# Patient Record
Sex: Female | Born: 1955 | Race: White | Hispanic: No | Marital: Single | Smoking: Former smoker
Health system: Southern US, Community
[De-identification: ages and names within clinical notes are randomized; demographics above are authoritative.]

## PROBLEM LIST (undated history)

## (undated) DIAGNOSIS — M25559 Pain in unspecified hip: Secondary | ICD-10-CM

## (undated) DIAGNOSIS — E7800 Pure hypercholesterolemia, unspecified: Secondary | ICD-10-CM

## (undated) DIAGNOSIS — M549 Dorsalgia, unspecified: Secondary | ICD-10-CM

## (undated) DIAGNOSIS — K8020 Calculus of gallbladder without cholecystitis without obstruction: Secondary | ICD-10-CM

## (undated) HISTORY — DX: Pain in unspecified hip: M25.559

## (undated) HISTORY — PX: PATELLA FRACTURE SURGERY: SHX735

## (undated) HISTORY — DX: Dorsalgia, unspecified: M54.9

## (undated) HISTORY — DX: Pure hypercholesterolemia, unspecified: E78.00

## (undated) HISTORY — PX: CYST REMOVAL NECK: SHX6281

## (undated) HISTORY — DX: Calculus of gallbladder without cholecystitis without obstruction: K80.20

---

## 2000-08-07 ENCOUNTER — Encounter: Payer: Self-pay | Admitting: Family Medicine

## 2000-08-07 ENCOUNTER — Ambulatory Visit (HOSPITAL_COMMUNITY): Admission: RE | Admit: 2000-08-07 | Discharge: 2000-08-07 | Payer: Self-pay | Admitting: Family Medicine

## 2000-08-08 ENCOUNTER — Encounter: Payer: Self-pay | Admitting: Family Medicine

## 2000-08-08 ENCOUNTER — Ambulatory Visit (HOSPITAL_COMMUNITY): Admission: RE | Admit: 2000-08-08 | Discharge: 2000-08-08 | Payer: Self-pay | Admitting: Family Medicine

## 2000-09-22 ENCOUNTER — Ambulatory Visit (HOSPITAL_COMMUNITY): Admission: RE | Admit: 2000-09-22 | Discharge: 2000-09-22 | Payer: Self-pay | Admitting: Family Medicine

## 2000-09-22 ENCOUNTER — Encounter: Payer: Self-pay | Admitting: Family Medicine

## 2005-11-04 ENCOUNTER — Ambulatory Visit (HOSPITAL_COMMUNITY): Admission: RE | Admit: 2005-11-04 | Discharge: 2005-11-04 | Payer: Self-pay | Admitting: Obstetrics and Gynecology

## 2006-11-17 ENCOUNTER — Ambulatory Visit (HOSPITAL_COMMUNITY): Admission: RE | Admit: 2006-11-17 | Discharge: 2006-11-17 | Payer: Self-pay | Admitting: Family Medicine

## 2007-12-18 ENCOUNTER — Ambulatory Visit (HOSPITAL_COMMUNITY): Admission: RE | Admit: 2007-12-18 | Discharge: 2007-12-18 | Payer: Self-pay | Admitting: Family Medicine

## 2008-12-21 ENCOUNTER — Ambulatory Visit (HOSPITAL_COMMUNITY): Admission: RE | Admit: 2008-12-21 | Discharge: 2008-12-21 | Payer: Self-pay | Admitting: Family Medicine

## 2009-02-01 ENCOUNTER — Emergency Department (HOSPITAL_COMMUNITY): Admission: EM | Admit: 2009-02-01 | Discharge: 2009-02-01 | Payer: Self-pay | Admitting: Emergency Medicine

## 2009-02-01 ENCOUNTER — Encounter: Payer: Self-pay | Admitting: Orthopedic Surgery

## 2009-02-06 ENCOUNTER — Ambulatory Visit: Payer: Self-pay | Admitting: Orthopedic Surgery

## 2009-02-06 ENCOUNTER — Telehealth (INDEPENDENT_AMBULATORY_CARE_PROVIDER_SITE_OTHER): Payer: Self-pay | Admitting: *Deleted

## 2009-02-06 DIAGNOSIS — S82009A Unspecified fracture of unspecified patella, initial encounter for closed fracture: Secondary | ICD-10-CM | POA: Insufficient documentation

## 2009-02-07 ENCOUNTER — Ambulatory Visit (HOSPITAL_COMMUNITY): Admission: RE | Admit: 2009-02-07 | Discharge: 2009-02-07 | Payer: Self-pay | Admitting: Orthopedic Surgery

## 2009-02-08 ENCOUNTER — Ambulatory Visit: Payer: Self-pay | Admitting: Orthopedic Surgery

## 2009-02-08 ENCOUNTER — Telehealth: Payer: Self-pay | Admitting: Orthopedic Surgery

## 2009-02-13 ENCOUNTER — Ambulatory Visit: Payer: Self-pay | Admitting: Orthopedic Surgery

## 2009-02-17 ENCOUNTER — Encounter: Payer: Self-pay | Admitting: Orthopedic Surgery

## 2009-02-21 ENCOUNTER — Ambulatory Visit: Payer: Self-pay | Admitting: Orthopedic Surgery

## 2009-02-24 ENCOUNTER — Encounter: Payer: Self-pay | Admitting: Orthopedic Surgery

## 2009-02-27 ENCOUNTER — Encounter: Payer: Self-pay | Admitting: Orthopedic Surgery

## 2009-03-07 ENCOUNTER — Telehealth: Payer: Self-pay | Admitting: Orthopedic Surgery

## 2009-03-17 ENCOUNTER — Encounter: Payer: Self-pay | Admitting: Orthopedic Surgery

## 2009-03-22 ENCOUNTER — Ambulatory Visit: Payer: Self-pay | Admitting: Orthopedic Surgery

## 2009-05-02 ENCOUNTER — Encounter: Payer: Self-pay | Admitting: Orthopedic Surgery

## 2009-05-03 ENCOUNTER — Ambulatory Visit: Payer: Self-pay | Admitting: Orthopedic Surgery

## 2009-05-05 ENCOUNTER — Telehealth: Payer: Self-pay | Admitting: Orthopedic Surgery

## 2009-05-11 ENCOUNTER — Encounter: Payer: Self-pay | Admitting: Orthopedic Surgery

## 2009-05-17 ENCOUNTER — Ambulatory Visit: Payer: Self-pay | Admitting: Orthopedic Surgery

## 2009-05-18 ENCOUNTER — Telehealth: Payer: Self-pay | Admitting: Orthopedic Surgery

## 2009-06-07 ENCOUNTER — Encounter: Payer: Self-pay | Admitting: Orthopedic Surgery

## 2009-06-13 ENCOUNTER — Encounter: Payer: Self-pay | Admitting: Orthopedic Surgery

## 2009-06-15 ENCOUNTER — Ambulatory Visit: Payer: Self-pay | Admitting: Orthopedic Surgery

## 2009-06-16 ENCOUNTER — Telehealth: Payer: Self-pay | Admitting: Orthopedic Surgery

## 2009-06-19 ENCOUNTER — Encounter: Payer: Self-pay | Admitting: Orthopedic Surgery

## 2009-06-29 ENCOUNTER — Telehealth: Payer: Self-pay | Admitting: Orthopedic Surgery

## 2009-06-29 ENCOUNTER — Telehealth (INDEPENDENT_AMBULATORY_CARE_PROVIDER_SITE_OTHER): Payer: Self-pay | Admitting: *Deleted

## 2009-07-10 ENCOUNTER — Telehealth: Payer: Self-pay | Admitting: Orthopedic Surgery

## 2009-07-10 ENCOUNTER — Encounter: Payer: Self-pay | Admitting: Orthopedic Surgery

## 2009-07-13 ENCOUNTER — Encounter: Payer: Self-pay | Admitting: Orthopedic Surgery

## 2009-07-17 ENCOUNTER — Encounter: Payer: Self-pay | Admitting: Orthopedic Surgery

## 2009-07-18 ENCOUNTER — Telehealth: Payer: Self-pay | Admitting: Orthopedic Surgery

## 2009-12-05 ENCOUNTER — Encounter: Payer: Self-pay | Admitting: Orthopedic Surgery

## 2009-12-19 IMAGING — CR DG TIBIA/FIBULA 2V*R*
2 series · 2 of 2 positions shown · non-contrast
Comparison: None

CLINICAL DATA: Right leg pain status post fall

RIGHT TIBIA AND FIBULA - 2 VIEW

[view not recorded (1 of 2)]
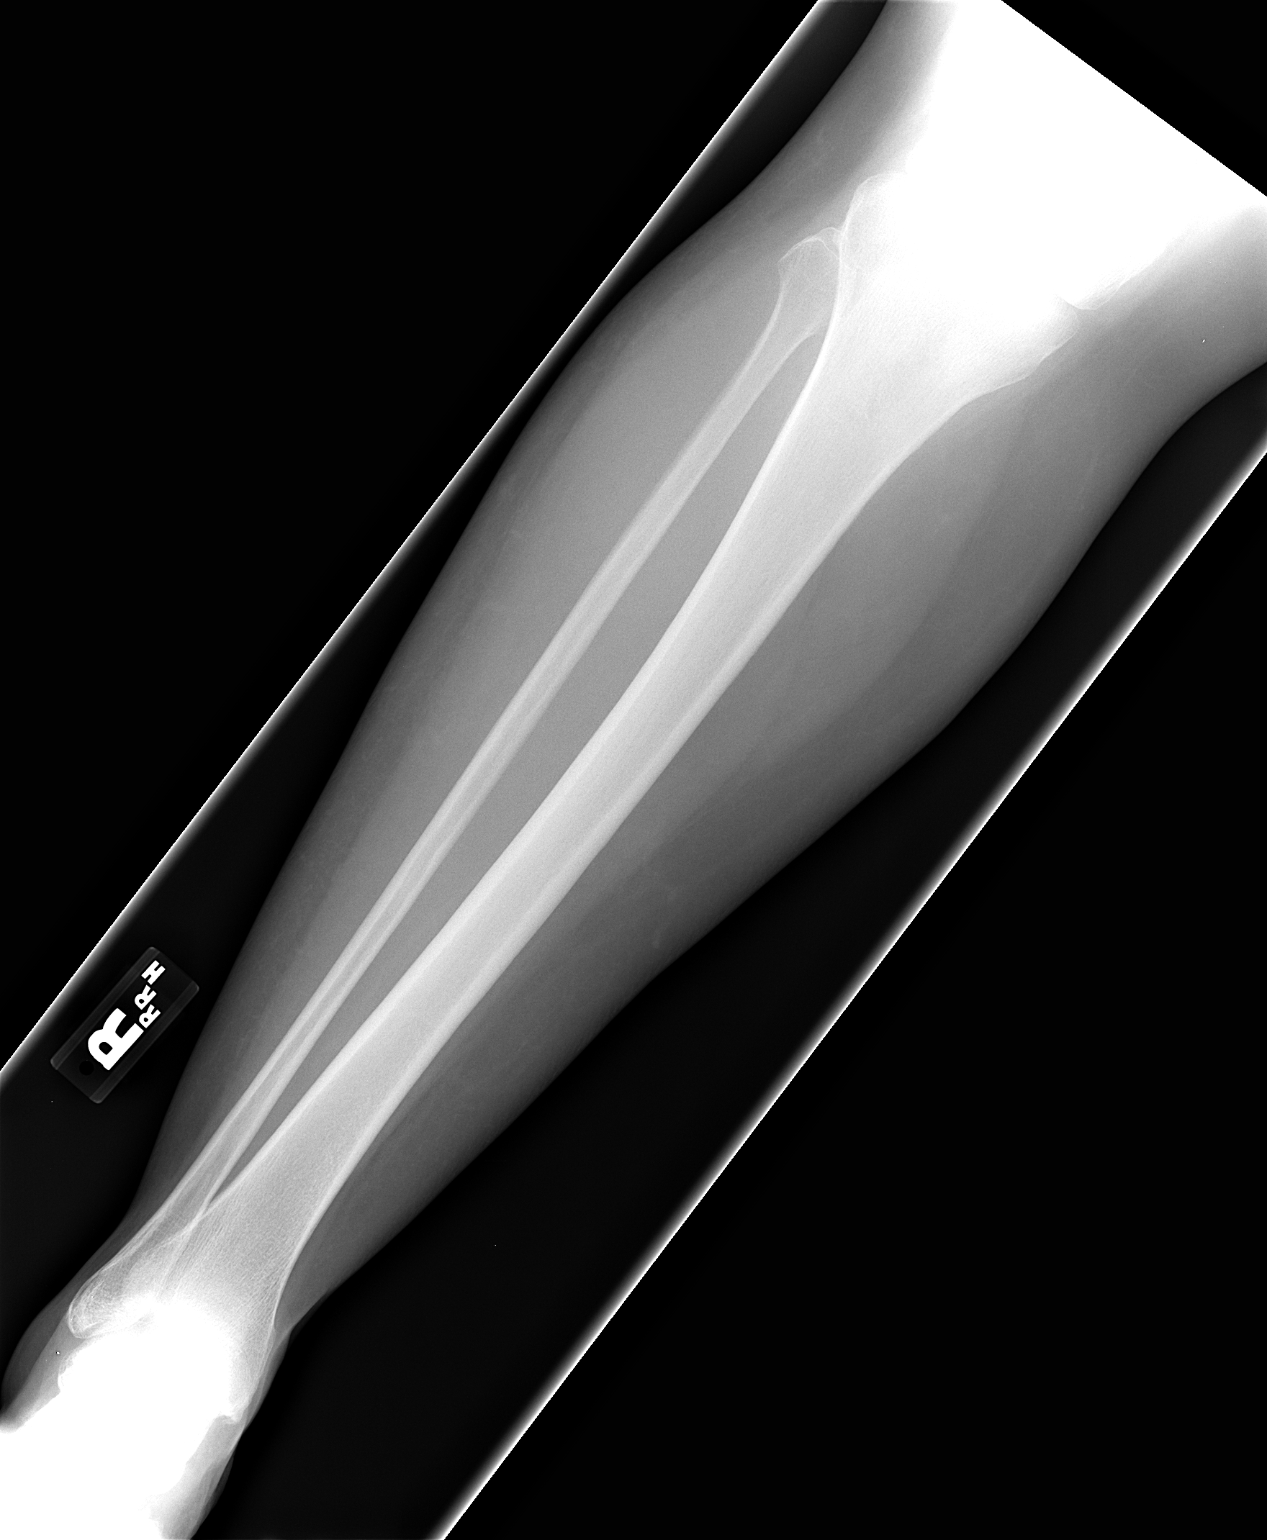

[view not recorded (2 of 2)]
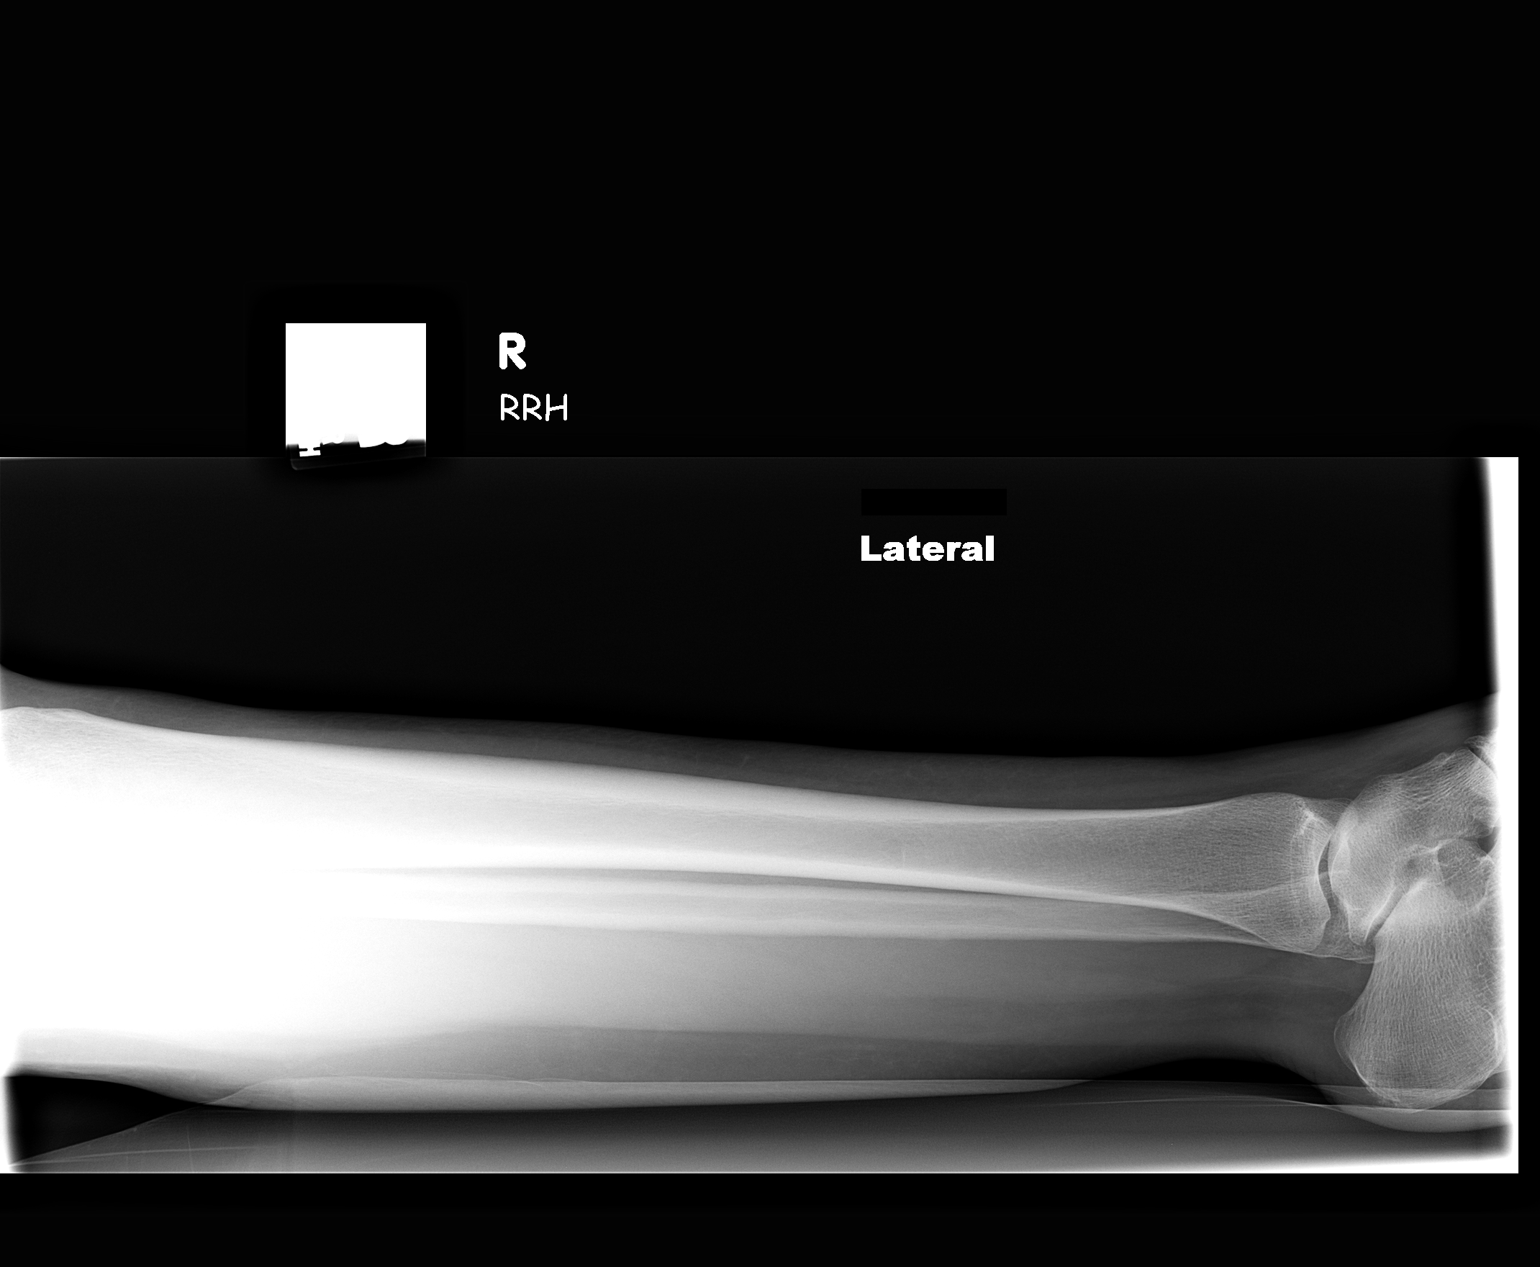

[2 of 2 positions shown; findings below may reference images not displayed]

FINDINGS: Right patellar fracture is better evaluated on the accompanying
knee radiographs.
Bones appear demineralized.
Medial and lateral compartments of the knee and the ankle joint
appear intact.
No additional fracture, dislocation or bone destruction.
Soft tissues unremarkable.
IMPRESSION: No acute right tibial or fibular abnormalities.

## 2009-12-25 ENCOUNTER — Ambulatory Visit (HOSPITAL_COMMUNITY): Admission: RE | Admit: 2009-12-25 | Discharge: 2009-12-25 | Payer: Self-pay | Admitting: Family Medicine

## 2010-04-10 NOTE — Progress Notes (Signed)
Summary: Work note fax to [employer] workers comp Lexicographer of Call: Faxed copy of work status Environmental education officer)  to [employer], worker's comp carrier, to Attn: Helene Kelp, Barrister's clerk, Fax #  [phone] / PH [phone]. Initial call taken by: Cammie Sickle,  Jul 18, 2009 5:59 PM

## 2010-04-10 NOTE — Progress Notes (Signed)
Summary: Work Scientist, product/process development of Call: NOTE:  Name of patient's Work Comp Nurse case Production designer, theatre/television/film who accompanied patient to last office visit:   Jacelyn Pi, ph # [phone] (also nurse mgr Cleda Clarks ph [phone]) Work Comp ins:  J[employer] / Jeris Penta ph [phone] or direct [phone] , fax [phone] Initial call taken by: Cammie Sickle,  June 29, 2009 4:15 PM

## 2010-04-10 NOTE — Letter (Signed)
Summary: Copy of Updated Work note  Copy of Updated Work note   Imported By: Cammie Sickle 07/26/2009 14:11:35  _____________________________________________________________________  External Attachment:    Type:   Image     Comment:   External Document

## 2010-04-10 NOTE — Assessment & Plan Note (Signed)
Summary: 4 WK RE-CK RT PATELLA/W.COMP/[employer] INS/CAF   Visit Type:  Follow-up  CC:  right patella.  History of Present Illness: I saw Sierra Browning in the office today for a 4 week  followup visit.  She is a 55 years old woman with the complaint of:  right patella  DOS 02/07/09 right knee  Procedure  OTIF right patella tension band technique.  Medication: none  she has made major improvement in her range of motion she now has active range of motion 123 of flexion full extension good extension power  She is concerned about palate building where she has to kneel  I agree that this should not be done  This will be a permanent restriction  She is at maximal medical improvement  She will have a permanent restriction I will determine this later    Allergies: No Known Drug Allergies   Impression & Recommendations:  Problem # 1:  AFTERCARE FOLLOW SURGERY MUSCULOSKEL SYSTEM NEC (ICD-V58.78) Assessment Improved  Orders: Est. Patient Level III (04540)  Problem # 2:  CLOSED FRACTURE OF PATELLA (ICD-822.0) Assessment: Improved  Patient Instructions: 1)  No Pallet Building  2)  May return to work: with Permanent restrictions of no pallet buliding, Limit squatting or kneeling  to less than 5 minutes at one time  3)  MMI reached  4)  RTW date: Monday  5)  f/u as needed 6)  rating to follow

## 2010-04-10 NOTE — Progress Notes (Signed)
Summary: Office notes + work status note faxed  Phone Note Air cabin crew of Call: Faxed office note and work status note to [employer]) to contact Nadine Case; and to Work Comp, [employer] [phone] Initial call taken by: Cammie Sickle,  June 16, 2009 12:47 PM

## 2010-04-10 NOTE — Progress Notes (Signed)
Summary: Medical office notes and work note faxed  Phone Note Outgoing Call   Summary of Call: As per empllyer request ([employer]), and with signed patient authorization,  - called HR manager Earlene Plater [phone], notified of work status - states patient will be rec'g FMLA forms to be completed  - contacted medical dept/safety mgr Nadine Case with updated work status  - faxed off notes to work comp nurse case mgr Helene Kelp at [facility]; fax [phone]. Initial call taken by: Cammie Sickle,  May 05, 2009 5:37 PM

## 2010-04-10 NOTE — Letter (Signed)
Summary: Work Note  [facility]  [address]  [place], [place] [address]   Phone: [phone]  Fax: [phone]    Today's Date: Jul 10, 2009  Name of Patient: Sierra Browning [facility]  The above named patient had a medical visit today at: 10:15 am  Please take this into consideration when reviewing the time away from workl.    Special Instructions:   [ X] To continue out of work status, through June 18, 2009;  May return to work on June 19, 2009,  with Permanent restrictions as follows:   June 19, 2009:   - No pallet building    -  Limit squatting and kneeling to less than 5 minutes at one time, every 30 minutes    [X]  Follow up on as needed basis.  ________________________________________________________________________   Sincerely yours,   Terrance Mass, MD

## 2010-04-10 NOTE — Assessment & Plan Note (Signed)
Summary: 2 WK RE-EVAL/RT PATELLA/W COMP/CHUB[employer] INS/CAF   Visit Type:  Follow-up  CC:  post op workers comp.  History of Present Illness: I saw Sierra Browning in the office today for a 4 WEEK followup visit.  She is a 55 years old woman with the complaint of:  RIGHT KNEE. surgery  Worker's compensation.  DOS 02/07/09 right knee  Procedure  OTIF right patella tension band technique.  Ibuprofen 800mg  as needed and Phenergan as needed.  Today to check ROM, evaluate if she needs manipulation after PT everyday for the last 2 weeks.  She can bend knee alot better today 118 degrees.  In post op brace today.  doing well improved   continue PT and OOW x 4 weeks   Allergies: No Known Drug Allergies   Impression & Recommendations:  Problem # 1:  AFTERCARE FOLLOW SURGERY MUSCULOSKEL SYSTEM NEC (ICD-V58.78)  Orders: Post-Op Check (04540)  Problem # 2:  CLOSED FRACTURE OF PATELLA (ICD-822.0)  Orders: Post-Op Check (98119)  Patient Instructions: 1)  Continue therapy 2)  come back to see [place] in 4 weeks 3)  OOW for 4 weeks

## 2010-04-10 NOTE — Assessment & Plan Note (Signed)
Summary: 6 WK RE-CK RT PATELLA/NO XRAY THIS VST/W.COMP,DOI 02/01/09/CAF   Visit Type:  Follow-up  CC:  post op workers comp.  History of Present Illness: I saw Sierra Browning in the office today for a 4 WEEK followup visit.  She is a 55 years old woman with the complaint of:  RIGHT KNEE. surgery  Worker's compensation.  DOS 02/07/09 right knee  week # 10   Procedure  OTIF right patella tension band technique.  Ibuprofen 800mg  as needed and Phenergan as needed.  She is having therapy in the [facility] at [facility] but her flexion is still only 90  I tried to bend her knee today and got it up to 105 but she did not tolerate it from a pain standpoint and I think this is the main problem.  I talked to the therapist asked him to work on a more.  I will see her daily for the next 2 weeks and then she'll come back and we'll reassess the situation  Allergies (verified): No Known Drug Allergies   Impression & Recommendations:  Problem # 1:  CLOSED FRACTURE OF PATELLA (ICD-822.0)  SPOKE WITH THERAPIST  -ASKED TO SEE DAILY AND DO AGGRESSIVE ROM IN SUPINE POSITION   Orders: Physical Therapy Referral (PT) Post-Op Check (04540)  Problem # 2:  AFTERCARE FOLLOW SURGERY MUSCULOSKEL SYSTEM NEC (ICD-V58.78)  Patient Instructions: 1)  BACK to THERAPY  2)  DAILY next 2 weeks  3)  then return here for re-evaluation.  4)  Recommended remaining out of work; out of work note provided.

## 2010-04-10 NOTE — Letter (Signed)
Summary: Work [facility]  [address]  [place], [place] [address]   Phone: [phone]  Fax: [phone]      Today's Date: June 15, 2009  Name of Patient: Sierra Browning [facility]  The above named patient had a medical visit today at: 10:15 am  Please take this into consideration when reviewing the time away from workl.    Special Instructions:   [ X] To continue out of work status, through June 18, 2009; Returning to work with Permanent restrictions as follows: on June 19, 2009:   - No pallet building    -  Limit squatting and kneeling to less than 5 minutes at one time    [X]  Follow up on as needed basis.  ________________________________________________________________________   Sincerely yours,   Terrance Mass, MD

## 2010-04-10 NOTE — Letter (Signed)
Summary: Out of Work  [facility]  [address]  [place], [place] [address]   Phone: [phone]  Fax: [phone]    March 22, 2009   Employee:  Sierra Browning    To Whom It May Concern:   For Medical reasons, please continue out of work status for the above named employee from work for the following dates:  Start:   February 01, 2009, continuing through the next 4 to 6 months  Next scheduled appointment: May 03, 2009  Approximate Return to Work:  Jul 31, 2009    If you need additional information, please feel free to contact our office.         Sincerely,    Terrance Mass, MD

## 2010-04-10 NOTE — Letter (Signed)
Summary: Out of Work  [facility]  [address]  [place], [place] [address]   Phone: [phone]  Fax: [phone]    May 17, 2009   Employee:  Sierra Browning Sierra Browning    To Whom It May Concern:   For Medical reasons, please excuse the above named employee from work for the following dates:  Start (Continue) Out of work status, original out of work start 02/01/10  End:   June 15, 2009  Next appointment date:  June 15, 2009, 10:15AM  Approximate Return to work date Jul 15, 2009  If you need additional information, please feel free to contact our office.         Sincerely,    Terrance Mass, MD

## 2010-04-10 NOTE — Miscellaneous (Signed)
Summary: Rehab Report  Rehab Report   Imported By: Elvera Maria 06/16/2009 10:09:07  _____________________________________________________________________  External Attachment:    Type:   Image     Comment:   progress note

## 2010-04-10 NOTE — Letter (Signed)
Summary: Workers Theatre stage manager on kneeling  Workers compensation clarification on kneeling   Imported By: Jacklynn Ganong 06/27/2009 10:26:25  _____________________________________________________________________  External Attachment:    Type:   Image     Comment:   External Document

## 2010-04-10 NOTE — Miscellaneous (Signed)
Summary: PT Plan of care  PT Plan of care   Imported By: Jacklynn Ganong 06/27/2009 09:08:39  _____________________________________________________________________  External Attachment:    Type:   Image     Comment:   External Document

## 2010-04-10 NOTE — Miscellaneous (Signed)
Summary: Rehab Report  Rehab Report   Imported By: Elvera Maria 05/19/2009 09:11:21  _____________________________________________________________________  External Attachment:    Type:   Image     Comment:   progress note

## 2010-04-10 NOTE — Medication Information (Signed)
Summary: Nutritional therapist wheelchair   Imported By: Cammie Sickle 06/06/2009 10:56:10  _____________________________________________________________________  External Attachment:    Type:   Image     Comment:   External Document

## 2010-04-10 NOTE — Letter (Signed)
Summary: Office notes faxed W Comp [employer]  Office notes faxed W Comp [employer]   Imported By: Cammie Sickle 05/03/2009 12:04:48  _____________________________________________________________________  External Attachment:    Type:   Image     Comment:   External Document

## 2010-04-10 NOTE — Progress Notes (Signed)
Summary: request further information work status  Phone Note Call from Patient   Summary of Call: Patient called to request information again per employer and per [employer], as to work restrictions and rating;  states employer tried to reach [place] Friday afternoon after hours. She has not yet returned to work. Work status note attached for review, completion, and signature/forms in Dr's box. Initial call taken by: Cammie Sickle,  Jul 10, 2009 2:32 PM

## 2010-04-10 NOTE — Letter (Signed)
Summary: FMLA form  FMLA form   Imported By: Cammie Sickle 06/12/2009 12:08:42  _____________________________________________________________________  External Attachment:    Type:   Image     Comment:   External Document

## 2010-04-10 NOTE — Letter (Signed)
Summary: Work Production manager questionnaire  Work Comp Medical Status questionnaire   Imported By: Cammie Sickle 06/07/2009 12:46:27  _____________________________________________________________________  External Attachment:    Type:   Image     Comment:   External Document

## 2010-04-10 NOTE — Medication Information (Signed)
Summary: Chief Executive Officer   Imported By: Cammie Sickle 06/06/2009 10:54:31  _____________________________________________________________________  External Attachment:    Type:   Image     Comment:   External Document

## 2010-04-10 NOTE — Letter (Signed)
Summary: Eval for Permanent Impairment  Eval for Permanent Impairment   Imported By: Cammie Sickle 12/30/2009 19:46:02  _____________________________________________________________________  External Attachment:    Type:   Image     Comment:   External Document

## 2010-04-10 NOTE — Letter (Signed)
Summary: Generic Letter Work Release note  [facility]  [address]  [place], [place] [address]   Phone: [phone]  Fax: [phone]    07/17/2009  RE:   [name] [address] [place], [place]  [address]   To whom it may concern  Reference Sierra Browning, date of birth November 19, 1955 with the medical record number of [id number]  Listed below he will find the permanent partial impairment rating and instructions for restrictions regarding this patient  May return to work on June 19, 2009,  with Permanent restrictions as follows:   June 19, 2009:   - No pallet building    -  Limit squatting and kneeling to less than 5 minutes at one time, every 30 minutes  using the [employer] as a source the permanent partial impairment will be 15% of the RIGHT knee secondary to lack of full range of motion, and surgical treatment of the RIGHT knee for patella  * Addendum:  Jul 13, 2009  - May stand for 8 hour shift as required   Sincerely,    Fuller Canada MD

## 2010-04-10 NOTE — Assessment & Plan Note (Signed)
Summary: 4 WK RE-CK/XRAYS RT PATELLA,DOI 02/01/09/W.COMP/CAF   Visit Type:  Follow-up  CC:  postop visit.  History of Present Illness: I saw Sierra Browning in the office today for a 4 WEEK followup visit.  She is a 55 years old woman with the complaint of:  RIGHT KNEE. surgery  Worker's compensation.  Date of surgery  DOS 02/07/09 right knee  Procedure  OTIF right patella tension band technique.  Medication  Ibuprofen 800 and Phenergan.  She needs her Phenergan refilled.  Subjectives Recheck after starting PT and xrays.  Patient states her knee is doing better.  Knee incision looks great. Her patella seems a little tight, was only able to get 40 of knee flexion. PT notes indicate 65, brace is set at 70.  AP and lateral RIGHT knee X-rays show internal fixation of the patella with near anatomic alignment. status post O TIF of the patella with near anatomic alignment  Assessment doing well, but the knee seems little tight in her range of motion should be 90 at this time.  Recommend increase the flexion with passive, active, and active-assisted range of motion, continue brace advance as tolerated. Followup in 6 weeks recheck     Allergies: No Known Drug Allergies   Medications Added to Medication List This Visit: 1)  Promethazine Hcl 25 Mg Tabs (Promethazine hcl) .Marland Kitchen.. 1 q 4 as needed  Other Orders: Post-Op Check (14782) Knee x-ray, 1/2 views (95621)  Patient Instructions: 1)  6 weeks f/u  Prescriptions: PROMETHAZINE HCL 25 MG TABS (PROMETHAZINE HCL) 1 q 4 as needed  #60 x 2   Entered and Authorized by:   Fuller Canada MD   Signed by:   Fuller Canada MD on 03/22/2009   Method used:   Handwritten   RxID:   [id number]

## 2010-04-10 NOTE — Progress Notes (Signed)
Summary: How many PT visits per week?  Phone Note Other Incoming   Summary of Call: Amy at [employer] called regarding Labrina Lines (Aug 23, 2055) Do you want Devlin to continue PT every day or reduce the number of visits per week. Amy's # [phone] Initial call taken by: Jacklynn Ganong,  May 18, 2009 1:33 PM  Follow-up for Phone Call        3 Follow-up by: Fuller Canada MD,  May 18, 2009 2:43 PM  Additional Follow-up for Phone Call Additional follow up Details #1::        Advised  Amy of Dr. Mort Sawyers reply Additional Follow-up by: Jacklynn Ganong,  May 18, 2009 3:40 PM

## 2010-04-10 NOTE — Progress Notes (Signed)
Summary: Employer request information  Phone Note Call from Patient   Caller: Patient Summary of Call: Patient has brought in Employee request form.  Advised that her portion is to be completed.  She has her job description attached.  States HR manager needs specifics on restrictions. Dr Romeo Apple to review, and to forward in stamped envelope provided.  Fwd'd to Dr Romeo Apple ( in box).   Initial call taken by: Cammie Sickle,  June 29, 2009 4:08 PM

## 2010-04-10 NOTE — Letter (Signed)
Summary: Authorization to release to employer  Authorization to release to employer   Imported By: Cammie Sickle 05/17/2009 16:01:18  _____________________________________________________________________  External Attachment:    Type:   Image     Comment:   External Document

## 2010-04-10 NOTE — Letter (Signed)
Summary: Generic Letter  [facility]  [address]  [place], [place] [address]   Phone: [phone]  Fax: [phone]    07/13/2009  To whom it may concern  Reference Sierra Browning, date of birth September 02, 1955 with the medical record number of [id number]  Listed below he will find the permanent partial impairment rating and instructions for restrictions regarding this patient  May return to work on June 19, 2009,  with Permanent restrictions as follows:   June 19, 2009:   - No pallet building    -  Limit squatting and kneeling to less than 5 minutes at one time, every 30 minutes  using the [employer] as a source the permanent partial impairment will be 15% of the RIGHT knee secondary to lack of full range of motion, and surgical treatment of the RIGHT knee for patella          Sincerely,   Fuller Canada MD

## 2010-04-10 NOTE — Miscellaneous (Signed)
Summary: PT Progress evaluation  PT Progress evaluation   Imported By: Jacklynn Ganong 05/10/2009 07:57:07  _____________________________________________________________________  External Attachment:    Type:   Image     Comment:   External Document

## 2010-04-10 NOTE — Letter (Signed)
Summary: Office notes fax to [employer]  Office notes fax to [employer]   Imported By: Cammie Sickle 05/03/2009 12:03:27  _____________________________________________________________________  External Attachment:    Type:   Image     Comment:   External Document

## 2010-04-10 NOTE — Letter (Signed)
Summary: Out of Work  [facility]  [address]  [place], [place] [address]   Phone: [phone]  Fax: [phone]    May 03, 2009   Employee:  WINNA GOLLA Medlin    To Whom It May Concern:   For Medical reasons, please excuse the above named employee from work for the following dates:   Continue out of work status,   Start date: 02/01/09   End date:   06/01/2009, or until further notice   Next scheduled appointment:  05/17/09 at 12:00 Noon   If you need additional information, please feel free to contact our office.         Sincerely,    Terrance Mass, MD

## 2010-06-13 LAB — BASIC METABOLIC PANEL
BUN: 13 mg/dL (ref 6–23)
CO2: 28 mEq/L (ref 19–32)
GFR calc non Af Amer: >60 mL/min (ref >60)
Glucose, Bld: 108 mg/dL — ABNORMAL HIGH (ref 70–99)
Potassium: 4.2 mEq/L (ref 3.5–5.1)
Sodium: 137 mEq/L (ref 135–145)

## 2010-11-28 ENCOUNTER — Other Ambulatory Visit (HOSPITAL_COMMUNITY): Payer: Self-pay | Admitting: Family Medicine

## 2010-11-28 DIAGNOSIS — Z139 Encounter for screening, unspecified: Secondary | ICD-10-CM

## 2010-12-31 ENCOUNTER — Ambulatory Visit (HOSPITAL_COMMUNITY)
Admission: RE | Admit: 2010-12-31 | Discharge: 2010-12-31 | Disposition: A | Discharge status: Home / Self Care | Payer: No Typology Code available for payment source | Source: Ambulatory Visit | Attending: Family Medicine | Admitting: Family Medicine

## 2010-12-31 DIAGNOSIS — Z1231 Encounter for screening mammogram for malignant neoplasm of breast: Secondary | ICD-10-CM | POA: Insufficient documentation

## 2010-12-31 DIAGNOSIS — Z139 Encounter for screening, unspecified: Secondary | ICD-10-CM

## 2012-07-15 ENCOUNTER — Telehealth: Payer: Self-pay | Admitting: Orthopedic Surgery

## 2012-07-15 NOTE — Telephone Encounter (Signed)
Patient called to ask if she may get a temporary handicapped placard.  She was last seen here 06/16/09, knee evaluation/fracture care for "broken knee cap" (healed).  She did not provide any detailed information about why she needs; states knee is "doing okay."  Please advise.  Phone [phone].

## 2012-07-16 NOTE — Telephone Encounter (Signed)
Called back to patient; explained that we would need to be able to support the medical reason for the parking permit, and reviewed that her last visit was >3 years ago as noted.  I offered appointment, in order to re-evaluate knee; patient does not wish to schedule at this time.  Aware we are therefore unable to sign for a parking permit at this time.

## 2012-07-16 NOTE — Telephone Encounter (Signed)
Why does she need it then ??

## 2013-04-20 ENCOUNTER — Ambulatory Visit: Payer: No Typology Code available for payment source

## 2013-04-20 ENCOUNTER — Ambulatory Visit (INDEPENDENT_AMBULATORY_CARE_PROVIDER_SITE_OTHER): Payer: No Typology Code available for payment source | Admitting: Orthopedic Surgery

## 2013-04-20 VITALS — BP 130/77 | Ht 64.5 in | Wt 148.0 lb

## 2013-04-20 DIAGNOSIS — Z9889 Other specified postprocedural states: Secondary | ICD-10-CM

## 2013-04-20 DIAGNOSIS — R110 Nausea: Secondary | ICD-10-CM

## 2013-04-20 MED ORDER — PROMETHAZINE HCL 12.5 MG PO TABS: 12.5000 mg | ORAL_TABLET | Freq: Four times a day (QID) | ORAL | Status: DC | PRN

## 2013-04-20 NOTE — Patient Instructions (Signed)
activities as tolerated 

## 2013-04-20 NOTE — Progress Notes (Signed)
Patient ID: Bunnie PionSandra G Browning, female   DOB: 1956/01/11, 58 y.o.   MRN: [id number] Chief Complaint  Patient presents with  . Knee Problem    Re evaluation of right knee for permanent job restrictions    The patient really came in because her forms needed to be updated she had O2 treatment internal fixation of the right patella and 2010 she returned to work. 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

## 2013-04-23 ENCOUNTER — Encounter: Payer: Self-pay | Admitting: Orthopedic Surgery

## 2015-06-07 ENCOUNTER — Other Ambulatory Visit (HOSPITAL_COMMUNITY): Payer: Self-pay | Admitting: Family Medicine

## 2015-06-07 ENCOUNTER — Ambulatory Visit (HOSPITAL_COMMUNITY)
Admission: RE | Admit: 2015-06-07 | Discharge: 2015-06-07 | Disposition: A | Discharge status: Home / Self Care | Payer: BLUE CROSS/BLUE SHIELD | Source: Ambulatory Visit | Attending: Family Medicine | Admitting: Family Medicine

## 2015-06-07 DIAGNOSIS — M79601 Pain in right arm: Secondary | ICD-10-CM

## 2015-11-29 ENCOUNTER — Ambulatory Visit (HOSPITAL_COMMUNITY)
Admission: RE | Admit: 2015-11-29 | Discharge: 2015-11-29 | Disposition: A | Discharge status: Home / Self Care | Payer: BLUE CROSS/BLUE SHIELD | Source: Ambulatory Visit | Attending: Family Medicine | Admitting: Family Medicine

## 2015-11-29 ENCOUNTER — Other Ambulatory Visit (HOSPITAL_COMMUNITY): Payer: Self-pay | Admitting: Family Medicine

## 2015-11-29 DIAGNOSIS — M160 Bilateral primary osteoarthritis of hip: Secondary | ICD-10-CM | POA: Diagnosis not present

## 2015-11-29 DIAGNOSIS — E782 Mixed hyperlipidemia: Secondary | ICD-10-CM | POA: Diagnosis not present

## 2015-11-29 DIAGNOSIS — E663 Overweight: Secondary | ICD-10-CM | POA: Diagnosis not present

## 2015-11-29 DIAGNOSIS — M25551 Pain in right hip: Secondary | ICD-10-CM

## 2015-11-29 DIAGNOSIS — M5136 Other intervertebral disc degeneration, lumbar region: Secondary | ICD-10-CM | POA: Diagnosis not present

## 2015-11-29 DIAGNOSIS — Z1389 Encounter for screening for other disorder: Secondary | ICD-10-CM | POA: Diagnosis not present

## 2015-11-29 DIAGNOSIS — Z6827 Body mass index (BMI) 27.0-27.9, adult: Secondary | ICD-10-CM | POA: Diagnosis not present

## 2015-11-29 DIAGNOSIS — R5383 Other fatigue: Secondary | ICD-10-CM | POA: Diagnosis not present

## 2015-12-06 DIAGNOSIS — R945 Abnormal results of liver function studies: Secondary | ICD-10-CM | POA: Diagnosis not present

## 2015-12-06 DIAGNOSIS — Z6827 Body mass index (BMI) 27.0-27.9, adult: Secondary | ICD-10-CM | POA: Diagnosis not present

## 2015-12-06 DIAGNOSIS — R7309 Other abnormal glucose: Secondary | ICD-10-CM | POA: Diagnosis not present

## 2015-12-06 DIAGNOSIS — M25551 Pain in right hip: Secondary | ICD-10-CM | POA: Diagnosis not present

## 2015-12-06 DIAGNOSIS — R748 Abnormal levels of other serum enzymes: Secondary | ICD-10-CM | POA: Diagnosis not present

## 2015-12-26 DIAGNOSIS — M25551 Pain in right hip: Secondary | ICD-10-CM | POA: Diagnosis not present

## 2015-12-26 DIAGNOSIS — R7309 Other abnormal glucose: Secondary | ICD-10-CM | POA: Diagnosis not present

## 2015-12-26 DIAGNOSIS — R945 Abnormal results of liver function studies: Secondary | ICD-10-CM | POA: Diagnosis not present

## 2015-12-26 DIAGNOSIS — R748 Abnormal levels of other serum enzymes: Secondary | ICD-10-CM | POA: Diagnosis not present

## 2016-01-04 DIAGNOSIS — Z23 Encounter for immunization: Secondary | ICD-10-CM | POA: Diagnosis not present

## 2016-01-11 DIAGNOSIS — M545 Low back pain: Secondary | ICD-10-CM | POA: Diagnosis not present

## 2016-01-11 DIAGNOSIS — Z6827 Body mass index (BMI) 27.0-27.9, adult: Secondary | ICD-10-CM | POA: Diagnosis not present

## 2016-01-11 DIAGNOSIS — Z1389 Encounter for screening for other disorder: Secondary | ICD-10-CM | POA: Diagnosis not present

## 2016-01-18 ENCOUNTER — Encounter (INDEPENDENT_AMBULATORY_CARE_PROVIDER_SITE_OTHER): Payer: Self-pay | Admitting: Internal Medicine

## 2016-01-18 ENCOUNTER — Encounter (INDEPENDENT_AMBULATORY_CARE_PROVIDER_SITE_OTHER): Payer: Self-pay

## 2016-01-25 ENCOUNTER — Ambulatory Visit (INDEPENDENT_AMBULATORY_CARE_PROVIDER_SITE_OTHER): Payer: BLUE CROSS/BLUE SHIELD | Admitting: Internal Medicine

## 2016-01-25 ENCOUNTER — Encounter (INDEPENDENT_AMBULATORY_CARE_PROVIDER_SITE_OTHER): Payer: Self-pay | Admitting: Internal Medicine

## 2016-01-25 ENCOUNTER — Encounter (INDEPENDENT_AMBULATORY_CARE_PROVIDER_SITE_OTHER): Payer: Self-pay

## 2016-01-25 VITALS — BP 148/82 | HR 80 | Temp 98.1°F | Ht 64.5 in | Wt 164.3 lb

## 2016-01-25 DIAGNOSIS — M549 Dorsalgia, unspecified: Secondary | ICD-10-CM | POA: Insufficient documentation

## 2016-01-25 DIAGNOSIS — M25559 Pain in unspecified hip: Secondary | ICD-10-CM | POA: Insufficient documentation

## 2016-01-25 DIAGNOSIS — R748 Abnormal levels of other serum enzymes: Secondary | ICD-10-CM

## 2016-01-25 LAB — HEPATIC FUNCTION PANEL
ALK PHOS: 107 U/L (ref 33–130)
ALT: 23 U/L (ref 6–29)
AST: 17 U/L (ref 10–35)
Albumin: 4.1 g/dL (ref 3.6–5.1)
BILIRUBIN DIRECT: 0.1 mg/dL (ref <=0.2)
BILIRUBIN INDIRECT: 0.4 mg/dL (ref 0.2–1.2)
BILIRUBIN TOTAL: 0.5 mg/dL (ref 0.2–1.2)
Total Protein: 6.5 g/dL (ref 6.1–8.1)

## 2016-01-25 NOTE — Patient Instructions (Addendum)
Will repeat Hepatic function.  OV in 3 months.

## 2016-01-25 NOTE — Progress Notes (Signed)
   Subjective:    Patient ID: Sierra Browning, female    DOB: 15-May-1955, 60 y.o.   MRN: [id number]  HPI Referred by Dr. Phillips OdorGolding for elevated liver enzymes/colonoscopy. She tells me she had been taken an excessive amt of Motrin for back and hip pain. She was taking about 4 Motrin  A day. She had one incidence that she took 6 Motrin (200mg ). Her liver enzymes have never been elevated that she is aware. She has chronic rt hip pain and back pain since May of this year. No known injury. Her appetite is good. No weight loss.  She has just finished Prednisone for her back pain. She usually has a BM daily. No melena or BRRB. No change in her stools. She has never had a colonoscopy in the past. No family hx of colon cancer. No hx of IV drugs or tattoos.    11/29/2015 ALP 140, AST 27, ALT 36.     Review of Systems No past medical history on file.  No past surgical history on file.  No Known Allergies  Current Outpatient Prescriptions on File Prior to Visit  Medication Sig Dispense Refill  . promethazine (PHENERGAN) 12.5 MG tablet Take 1 tablet (12.5 mg total) by mouth every 6 (six) hours as needed for nausea or vomiting. (Patient not taking: Reported on 01/25/2016) 30 tablet 0   No current facility-administered medications on file prior to visit.        Objective:   Physical Exam Blood pressure (!) 148/82, pulse 80, temperature 98.1 F (36.7 C), height 5' 4.5" (1.638 m), weight 164 lb 4.8 oz (74.5 kg).  Alert and oriented. Skin warm and dry. Oral mucosa is moist.   . Sclera anicteric, conjunctivae is pink. Thyroid not enlarged. No cervical lymphadenopathy. Lungs clear. Heart regular rate and rhythm.  Abdomen is soft. Bowel sounds are positive. No hepatomegaly. No abdominal masses felt. No tenderness.  No edema to lower extremities.        Assessment & Plan:  Elevated liver enzymes: Am going to repeat number.   Screening colonoscopy.She would like to wait for the colonoscopy. She  has no insurance and would like to wait till after her MRI of the back.

## 2016-01-26 ENCOUNTER — Telehealth (INDEPENDENT_AMBULATORY_CARE_PROVIDER_SITE_OTHER): Payer: Self-pay | Admitting: Internal Medicine

## 2016-01-26 NOTE — Telephone Encounter (Signed)
Hepatic function I 3 months.

## 2016-01-29 ENCOUNTER — Other Ambulatory Visit (INDEPENDENT_AMBULATORY_CARE_PROVIDER_SITE_OTHER): Payer: Self-pay | Admitting: *Deleted

## 2016-01-29 DIAGNOSIS — R748 Abnormal levels of other serum enzymes: Secondary | ICD-10-CM

## 2016-01-29 NOTE — Telephone Encounter (Signed)
Hepatic Function is noted for 3 months. A letter will be sent as a reminder.

## 2016-02-21 ENCOUNTER — Ambulatory Visit (INDEPENDENT_AMBULATORY_CARE_PROVIDER_SITE_OTHER): Payer: BLUE CROSS/BLUE SHIELD | Admitting: Orthopedic Surgery

## 2016-02-21 ENCOUNTER — Encounter: Payer: Self-pay | Admitting: Orthopedic Surgery

## 2016-02-21 ENCOUNTER — Ambulatory Visit (INDEPENDENT_AMBULATORY_CARE_PROVIDER_SITE_OTHER): Payer: BLUE CROSS/BLUE SHIELD

## 2016-02-21 VITALS — BP 125/90 | HR 105 | Ht 65.5 in | Wt 168.0 lb

## 2016-02-21 DIAGNOSIS — M5126 Other intervertebral disc displacement, lumbar region: Secondary | ICD-10-CM | POA: Diagnosis not present

## 2016-02-21 DIAGNOSIS — M5441 Lumbago with sciatica, right side: Secondary | ICD-10-CM | POA: Diagnosis not present

## 2016-02-21 MED ORDER — GABAPENTIN 100 MG PO CAPS: 100.0000 mg | ORAL_CAPSULE | Freq: Three times a day (TID) | ORAL | 2 refills | Status: DC

## 2016-02-21 NOTE — Progress Notes (Signed)
Patient ID: Sierra PionSandra G Browning, female   DOB: [birth year], [age] y.o.   MRN: [id number]  Chief Complaint  Patient presents with  . Back Pain    BACK PAIN WITH RIGHT SIDED RADICULAR PAIN    HPI Sierra Browning is a 60 y.o. female.  60 year old female presents with 6 month history of pain in her lower back which progressively radiated to her right leg associated with numbness tingling and weakness. Prior treatment includes 3 IM shots of prednisone and ibuprofen  2 rounds of prednisone Dosepak without relief  The patient was told not to take anti-inflammatory second to elevation of her LFTs which resolved when her Motrin stopped  Review of Systems Review of Systems  Constitutional: Negative for fever.  Cardiovascular: Negative.   Gastrointestinal: Negative.   Genitourinary: Negative.   Musculoskeletal: Positive for back pain.  Neurological: Positive for numbness. Negative for weakness.    Past Medical History:  Diagnosis Date  . Back pain   . Hip pain    rt    Past Surgical History:  Procedure Laterality Date  . PATELLA FRACTURE SURGERY      No family history on file. was reviewed  Social History Social History  Substance Use Topics  . Smoking status: Former Games developermoker  . Smokeless tobacco: Never Used  . Alcohol use No    No Known Allergies  No current outpatient prescriptions on file.   No current facility-administered medications for this visit.      Physical Exam Physical Exam  Constitutional: She is oriented to person, place, and time. She appears well-developed and well-nourished. No distress.  Cardiovascular: Normal rate and intact distal pulses.   Neurological: She is alert and oriented to person, place, and time. She has normal reflexes. She exhibits normal muscle tone. Coordination normal.  Skin: Skin is warm and dry. No rash noted. She is not diaphoretic. No erythema. No pallor.  Psychiatric: She has a normal mood and affect. Her behavior is normal. Judgment  and thought content normal.    Ortho Exam   Neurologic examination  Reflexes were 2+ and equal  Sensation was normal in both feet and legs  Babinski's tests were down going  Straight leg raise testing was normal on the left but abnormal on the right and the femoral nerve stretch test was also abnormal on the right  The vascular examination revealed normal dorsalis pedis pulses in both feet and both feet were warm with good capillary refill   Data Reviewed Plain films show facet arthritis at L5-S1 and L4 and L5 with anterolisthesis of L4 on L5 spondylosis moderate to severe  Assessment  Encounter Diagnoses  Name Primary?  . Bilateral low back pain with right-sided sciatica, unspecified chronicity   . Lumbar disc herniation Yes      Plan  Recommend MRI lumbar spine imaged neural elements to prepare for epidural injections  Gabapentin 100 mg 3 times a day  Meds ordered this encounter  Medications  . gabapentin (NEURONTIN) 100 MG capsule    Sig: Take 1 capsule (100 mg total) by mouth 3 (three) times daily.    Dispense:  90 capsule    Refill:  2      Fuller CanadaStanley Yenifer Saccente, MD 02/21/2016 11:18 AM

## 2016-02-21 NOTE — Patient Instructions (Addendum)
We will schedule mri and call you with appt Out of work 4 weeks

## 2016-02-28 ENCOUNTER — Ambulatory Visit (HOSPITAL_COMMUNITY): Payer: BLUE CROSS/BLUE SHIELD

## 2016-03-01 ENCOUNTER — Ambulatory Visit: Payer: BLUE CROSS/BLUE SHIELD | Admitting: Orthopedic Surgery

## 2016-03-13 ENCOUNTER — Ambulatory Visit (HOSPITAL_COMMUNITY)
Admission: RE | Admit: 2016-03-13 | Discharge: 2016-03-13 | Disposition: A | Discharge status: Home / Self Care | Payer: BLUE CROSS/BLUE SHIELD | Source: Ambulatory Visit | Attending: Orthopedic Surgery | Admitting: Orthopedic Surgery

## 2016-03-13 DIAGNOSIS — M545 Low back pain: Secondary | ICD-10-CM | POA: Diagnosis not present

## 2016-03-13 DIAGNOSIS — M48061 Spinal stenosis, lumbar region without neurogenic claudication: Secondary | ICD-10-CM | POA: Insufficient documentation

## 2016-03-13 DIAGNOSIS — M5126 Other intervertebral disc displacement, lumbar region: Secondary | ICD-10-CM | POA: Diagnosis not present

## 2016-03-13 DIAGNOSIS — M4686 Other specified inflammatory spondylopathies, lumbar region: Secondary | ICD-10-CM | POA: Diagnosis not present

## 2016-03-13 DIAGNOSIS — M4316 Spondylolisthesis, lumbar region: Secondary | ICD-10-CM | POA: Insufficient documentation

## 2016-03-20 ENCOUNTER — Encounter: Payer: Self-pay | Admitting: Orthopedic Surgery

## 2016-03-20 ENCOUNTER — Ambulatory Visit (INDEPENDENT_AMBULATORY_CARE_PROVIDER_SITE_OTHER): Payer: BLUE CROSS/BLUE SHIELD | Admitting: Orthopedic Surgery

## 2016-03-20 ENCOUNTER — Other Ambulatory Visit: Payer: Self-pay | Admitting: *Deleted

## 2016-03-20 DIAGNOSIS — M47816 Spondylosis without myelopathy or radiculopathy, lumbar region: Secondary | ICD-10-CM | POA: Diagnosis not present

## 2016-03-20 DIAGNOSIS — M5441 Lumbago with sciatica, right side: Secondary | ICD-10-CM | POA: Diagnosis not present

## 2016-03-20 DIAGNOSIS — M5126 Other intervertebral disc displacement, lumbar region: Secondary | ICD-10-CM

## 2016-03-20 MED ORDER — TRAMADOL HCL 50 MG PO TABS: 50.0000 mg | ORAL_TABLET | Freq: Four times a day (QID) | ORAL | 1 refills | Status: DC | PRN

## 2016-03-20 MED ORDER — NABUMETONE 500 MG PO TABS: 500.0000 mg | ORAL_TABLET | Freq: Two times a day (BID) | ORAL | 0 refills | Status: DC

## 2016-03-20 NOTE — Addendum Note (Signed)
Addended by: Adella HareBOOTHE, Dean Goldner B on: 03/20/2016 10:50 AM   Modules accepted: Orders

## 2016-03-20 NOTE — Patient Instructions (Signed)
Out of work note for 6 weeks

## 2016-03-20 NOTE — Progress Notes (Signed)
Patient ID: Sierra Browning, female   DOB: [birth year], 61 y.o.   MRN: [id number]  Chief Complaint  Patient presents with  . Follow-up    MRI FOLLOW UP L SPINE    HPI Sierra PionSandra G Weide is a 61 y.o. female.   HPI  61 year old female with a six-month history of lower back pain progressively radiating to her right leg associated with numbness and tingling and weakness unrelieved by intramuscular prednisone and ibuprofen as well as oral dose pack. We tried gabapentin and she was allergic itching  She had an MRI and it shows severe facet arthritis at L4-5  Continues to complain of severe pain  Review of Systems Review of Systems   She has normal bowel and bladder function  Physical Exam  See note dated 02/21/2016   MEDICAL DECISION MAKING  DATA   Lumbar spine MRI: Personal review of the MRI  Her MRI indeed shows degenerative disc disease throughout the lumbar spine worse at L4-5  MRI report L2-3: Minimal disc bulging and mild facet hypertrophy without stenosis.   L3-4: Minimal disc bulging and mild-to-moderate facet and ligamentum flavum hypertrophy without significant stenosis.   L4-5: Mild disc space narrowing. Mild disc bulging, mild ligamentum flavum thickening, and moderate to severe facet arthrosis result in mild spinal stenosis, mild bilateral lateral recess stenosis, and minimal bilateral neural foraminal narrowing.   L5-S1: Evidence of vacuum disc phenomenon. No disc herniation or stenosis.   IMPRESSION: L4-5 facet arthritis with slight anterolisthesis and mild spinal and lateral recess stenosis.     Electronically Signed   By: Sebastian AcheAllen  Grady M.D.   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  . Bilateral low back pain with right-sided sciatica, unspecified chronicity Yes  . Lumbar disc herniation   . Lumbar spondylosis     PLAN(RISK)     Recommend physical therapy 3 times a week for 6 weeks  Out of work for 6 weeks  Follow-up 7 weeks  Epidural steroid series at L4-5  Start Relafen 5 mg twice a day Ultram 50 mg every 6 hours when necessary

## 2016-03-26 ENCOUNTER — Ambulatory Visit
Admission: RE | Admit: 2016-03-26 | Discharge: 2016-03-26 | Disposition: A | Discharge status: Home / Self Care | Payer: BLUE CROSS/BLUE SHIELD | Source: Ambulatory Visit | Attending: Orthopedic Surgery | Admitting: Orthopedic Surgery

## 2016-03-26 DIAGNOSIS — M47816 Spondylosis without myelopathy or radiculopathy, lumbar region: Secondary | ICD-10-CM

## 2016-03-26 DIAGNOSIS — M5136 Other intervertebral disc degeneration, lumbar region: Secondary | ICD-10-CM | POA: Diagnosis not present

## 2016-03-26 MED ORDER — IOPAMIDOL (ISOVUE-M 200) INJECTION 41%
1.0000 mL | Freq: Once | INTRAMUSCULAR | Status: AC
  Administered 2016-03-26: 1 mL via EPIDURAL

## 2016-03-26 MED ORDER — METHYLPREDNISOLONE ACETATE 40 MG/ML INJ SUSP (RADIOLOG
120.0000 mg | Freq: Once | INTRAMUSCULAR | Status: AC
  Administered 2016-03-26: 120 mg via EPIDURAL

## 2016-03-26 NOTE — Discharge Instructions (Signed)

## 2016-03-28 ENCOUNTER — Ambulatory Visit (HOSPITAL_COMMUNITY): Payer: BLUE CROSS/BLUE SHIELD | Admitting: Physical Therapy

## 2016-04-02 ENCOUNTER — Ambulatory Visit (HOSPITAL_COMMUNITY): Payer: BLUE CROSS/BLUE SHIELD | Attending: Orthopedic Surgery | Admitting: Physical Therapy

## 2016-04-02 ENCOUNTER — Encounter (HOSPITAL_COMMUNITY): Payer: Self-pay | Admitting: Physical Therapy

## 2016-04-02 DIAGNOSIS — G8929 Other chronic pain: Secondary | ICD-10-CM

## 2016-04-02 DIAGNOSIS — R29898 Other symptoms and signs involving the musculoskeletal system: Secondary | ICD-10-CM | POA: Insufficient documentation

## 2016-04-02 DIAGNOSIS — M545 Low back pain: Secondary | ICD-10-CM | POA: Diagnosis not present

## 2016-04-02 DIAGNOSIS — M6281 Muscle weakness (generalized): Secondary | ICD-10-CM | POA: Diagnosis not present

## 2016-04-02 NOTE — Therapy (Signed)
[facility] [address] [place], [place], [address] Phone: [phone]   Fax:  [phone]  Physical Therapy Evaluation  Patient Details  Name: Sierra Browning MRN: [id number] Date of Birth: [birth year] Referring Provider: Fuller Canada, MD  Encounter Date: 04/02/2016      PT End of Session - 04/02/16 1053    Visit Number 1   Number of Visits 13   Date for PT Re-Evaluation 04/23/16   Authorization Type [employer]   Authorization Time Period 04/02/16 to 04/16/16   PT Start Time 0901   PT Stop Time 0944   PT Time Calculation (min) 43 min   Activity Tolerance No increased pain;Patient tolerated treatment well   Behavior During Therapy [facility] for tasks assessed/performed      Past Medical History:  Diagnosis Date  . Back pain   . Hip pain    rt    Past Surgical History:  Procedure Laterality Date  . PATELLA FRACTURE SURGERY      There were no vitals filed for this visit.       Subjective Assessment - 04/02/16 0905    Subjective Pt reports back pain even back in her teens when she would notice her back hurting if the ironing board was too low. She reports more recent onset of pain back in May 2017 following losing her job and caring for her mom for several months. She got a new job at [employer] and noticed severe low back pain with standing on her feet too long. She saw her PCP who referred her to Dr. Romeo Apple. Imaging was taken and found bulging discs and "severe arthritis". She got an injection and was sent to PT to help manage her pain. Overall, her pain remained unchanged while working but now she is on leave and sitting most of the day and it seems to be better.   Pertinent History Patella fx several years ago.    Limitations Standing;Walking   How long can you sit comfortably? unlimited in the right positions   How long can you stand comfortably? unsure, she has been sitting   How long can you walk comfortably? pt states she  walked 20 min last night.    Diagnostic tests MRI: bulging disc, facet arthritis   Patient Stated Goals decrease and get rid of pain    Currently in Pain? No/denies   Pain Location Other (Comment)  Rt buttock and pelvic region   Pain Orientation Right;Posterior   Pain Descriptors / Indicators Sharp;Aching   Pain Type Chronic pain   Pain Radiating Towards none recently, but it was shooting into her Rt posterior thigh   Pain Onset More than a month ago   Pain Frequency Intermittent   Aggravating Factors  being on her feet for a long time   Pain Relieving Factors sitting and positioning on each side    Effect of Pain on Daily Activities limited work tolerance, now on leave            [facility] PT Assessment - 04/02/16 0001      Assessment   Medical Diagnosis Lumbar spondylosis   Referring Provider Fuller Canada, MD   Onset Date/Surgical Date 08/01/15  approx   Next MD Visit 05/08/16   Prior Therapy none      Balance Screen   Has the patient fallen in the past 6 months No   Has the patient had a decrease in activity level because of a fear of falling?  No   Is the patient reluctant to leave their home because of a fear of falling?  No     Prior Function   Level of Independence Independent   Vocation Other (comment)  currently on leave, working 30hr/week prior to this     Cognition   Overall Cognitive Status Within Functional Limits for tasks assessed     Observation/Other Assessments   Observations       Sensation   Light Touch Not tested     Posture/Postural Control   Posture/Postural Control Postural limitations   Postural Limitations Posterior pelvic tilt;Rounded Shoulders     ROM / Strength   AROM / PROM / Strength AROM;Strength     AROM   AROM Assessment Site Lumbar   Lumbar Flexion RFIS x10 reps, pain reports coming back up in Rt hip/glute region   Lumbar Extension REIS x10 reps,      Strength   Overall Strength Comments Double straight leg lower, able  to lower 20 deg with back flat against table    Strength Assessment Site Hip;Knee;Ankle   Right/Left Hip Right;Left   Right Hip Flexion 4/5   Right Hip Extension 3+/5  limited by ROM   Right Hip ABduction 4/5   Left Hip Flexion 4/5   Left Hip Extension 3+/5  limited by ROM   Left Hip ABduction 4/5   Right/Left Knee Right;Left   Right Knee Flexion 4/5   Right Knee Extension 5/5   Left Knee Flexion 4/5   Left Knee Extension 5/5     Flexibility   Soft Tissue Assessment /Muscle Length yes   Hamstrings WNL   Piriformis Rt 75% limited, Lt 50% limited      Palpation   Spinal mobility hypomobile PAs lumbar region   Palpation comment tenderness: Rt glute med/max, Rt QL/lumbar paraspinals     Special Tests    Special Tests Hip Special Tests   Hip Special Tests  [employer] Test    Findings Positive   Comments BLE     Transfers   Five time sit to stand comments  12 sec, no UE                           PT Education - 04/02/16 1051    Education provided Yes   Education Details benefits of PT/exercise in improving pain and function in people with arthritis; initiated HEP and encouraged daily walking atleast 20 min to build up endurance and strength   Person(s) Educated Patient   Methods Explanation;Verbal cues;Handout   Comprehension Verbalized understanding;Returned demonstration          PT Short Term Goals - 04/02/16 1054      PT SHORT TERM GOAL #1   Title Pt will demo consistency and independence with her HEP, to improve BLE strength and mobility   Time 2   Period Weeks   Status New     PT SHORT TERM GOAL #2   Title Pt will demo improved trunk strength and stability evident by her ability to reach 46-75 degrees from the table before tilting of the pelvis during straight leg lower testing.   Time 3   Period Weeks   Status New     PT SHORT TERM GOAL #3   Title Pt will report walking atleast 20 min, daily, to improve overall fitness  and activity tolerance for return to work.    Time 3  Period Weeks   Status New           PT Long Term Goals - 04/02/16 1059      PT LONG TERM GOAL #1   Title Pt will demo improved BLE strength to atleast 4+/5 MMT to improve her safety with functional tasks.    Time 6   Period Weeks   Status New     PT LONG TERM GOAL #2   Title Pt will demo improved hip flexor flexibility evident by negative thomas test on BLE, to improve standing posture.    Time 6   Period Weeks   Status New     PT LONG TERM GOAL #3   Title Pt will demo improved functional strength and endurance evident by her ability to perform 5x sit to stand in less than 10 sec.    Time 6   Period Weeks   Status New     PT LONG TERM GOAL #4   Title Pt will lift no more than 10# box from various heights, demonstrating proper technique and with cues from the therapist no more than 25% of the time, to allow her to safely perform tasks at work.    Time 6   Period Weeks   Status New               Plan - 04/02/16 1229    Clinical Impression Statement Pt is a 61yo F referred to OPPT with LBP which has caused her to take leave from her job. She presents today with limitations in hip flexibility, trunk/hip weakness and overall sedentary life style which is contributing to her symptoms and limiting her participation in daily/work activity. Slump testing, passive straight leg raise and repeated movements were all negative in regards to reproducing her symptoms, however she does have muscle tightness/spasm along her Rt glute region/Rt QL. At this time, she would benefit from skilled PT to address her limitations listed above and improve her quality of life. She would also benefit from training of proper lifting mechanics to decrease risk of further injury at work. Eval findins/POC were discussed with the pt and she is in agreement with proposed PT frequency.    Rehab Potential Good   PT Frequency 2x / week   PT Duration 6  weeks   PT Treatment/Interventions ADLs/Self Care Home Management;Moist Heat;Gait training;Functional mobility training;Therapeutic activities;Therapeutic exercise;Patient/family education;Neuromuscular re-education;Manual techniques;Dry needling;Passive range of motion   PT Next Visit Plan Trunk flexibility and stability in supine: low trunk rotation at 90 deg with physioball,    PT Home Exercise Plan supine hip flexor stretch, lower trunk rotation at 90 deg   Recommended Other Services none    Consulted and Agree with Plan of Care Patient      Patient will benefit from skilled therapeutic intervention in order to improve the following deficits and impairments:  Decreased activity tolerance, Decreased endurance, Decreased mobility, Decreased range of motion, Hypomobility, Impaired flexibility, Pain, Postural dysfunction, Improper body mechanics, Increased muscle spasms  Visit Diagnosis: Chronic low back pain, unspecified back pain laterality, with sciatica presence unspecified  Muscle weakness (generalized)  Other symptoms and signs involving the musculoskeletal system     Problem List Patient Active Problem List   Diagnosis Date Noted  . Back pain 01/25/2016  . Hip pain 01/25/2016  . S/P knee surgery 04/20/2013  . Nausea alone 04/20/2013  . CLOSED FRACTURE OF PATELLA 02/06/2009    12:56 PM,04/02/16 Marylyn Ishihara PT, DPT [facility]  [phone]   [facility] [address] , [place], [address] Phone: [phone]   Fax:  [phone]  Name: Bunnie PionSandra G Wanek MRN: [id number] Date of Birth: May 30, 1955

## 2016-04-04 ENCOUNTER — Ambulatory Visit (HOSPITAL_COMMUNITY): Payer: BLUE CROSS/BLUE SHIELD | Admitting: Physical Therapy

## 2016-04-04 ENCOUNTER — Other Ambulatory Visit (INDEPENDENT_AMBULATORY_CARE_PROVIDER_SITE_OTHER): Payer: Self-pay | Admitting: *Deleted

## 2016-04-04 ENCOUNTER — Encounter (INDEPENDENT_AMBULATORY_CARE_PROVIDER_SITE_OTHER): Payer: Self-pay | Admitting: *Deleted

## 2016-04-04 DIAGNOSIS — M545 Low back pain: Principal | ICD-10-CM

## 2016-04-04 DIAGNOSIS — M6281 Muscle weakness (generalized): Secondary | ICD-10-CM

## 2016-04-04 DIAGNOSIS — R29898 Other symptoms and signs involving the musculoskeletal system: Secondary | ICD-10-CM | POA: Diagnosis not present

## 2016-04-04 DIAGNOSIS — G8929 Other chronic pain: Secondary | ICD-10-CM

## 2016-04-04 DIAGNOSIS — R748 Abnormal levels of other serum enzymes: Secondary | ICD-10-CM

## 2016-04-04 NOTE — Therapy (Signed)
[facility] [address] [place], [place], [address] Phone: [phone]   Fax:  [phone]  Physical Therapy Treatment  Patient Details  Name: Sierra Browning MRN: [id number] Date of Birth: 1955/11/11 Referring Provider: Fuller CanadaStanley Harrison, MD  Encounter Date: 04/04/2016      PT End of Session - 04/04/16 0951    Visit Number 2   Number of Visits 13   Date for PT Re-Evaluation 04/23/16   Authorization Type BCBS   Authorization Time Period 04/02/16 to 04/16/16   PT Start Time 0902   PT Stop Time 0945   PT Time Calculation (min) 43 min   Activity Tolerance No increased pain;Patient tolerated treatment well   Behavior During Therapy [facility] for tasks assessed/performed      Past Medical History:  Diagnosis Date  . Back pain   . Hip pain    rt    Past Surgical History:  Procedure Laterality Date  . PATELLA FRACTURE SURGERY      There were no vitals filed for this visit.      Subjective Assessment - 04/04/16 0905    Subjective Pt reports that things are going pretty good. She has been working on her HEP and has some issues with the technique. No other complaints at this time.    Pertinent History Patella fx several years ago.    Limitations Standing;Walking   How long can you sit comfortably? unlimited in the right positions   How long can you stand comfortably? unsure, she has been sitting   How long can you walk comfortably? pt states she walked 20 min last night.    Diagnostic tests MRI: bulging disc, facet arthritis   Patient Stated Goals decrease and get rid of pain    Pain Onset More than a month ago                         [facility] - 04/04/16 0001      Exercises   Exercises Other Exercises   Other Exercises  seated pelvic tilts Ant/post x20 reps      Knee/Hip Exercises: Stretches   Hip Flexor Stretch Both;3 reps;30 seconds   Other Knee/Hip Stretches Low trunk rotation  hips/knees at 90 deg with physioball x15 reps    Other Knee/Hip Stretches QL stretch on each side 3x15 sec      Knee/Hip Exercises: Supine   Bridges with Clamshell Both;2 sets;15 reps;Other (comment)  blue TB around knees    Other Supine Knee/Hip Exercises ab set with opposite LE/UE flexion extension to meet at mid line, x15 each    Other Supine Knee/Hip Exercises Ab set with 90 deg bent knee lower/raise from 8" step 2x10  tactile cues for improved deep abdominal activation                PT Education - 04/04/16 0944    Education provided Yes   Education Details encouraged continued HEP adherence and ways to incorporate her physioball at home during exercises; importance of slowing down during exercise to ensure she is using proper technique   Person(s) Educated Patient   Methods Explanation;Demonstration;Verbal cues;Tactile cues;Handout   Comprehension Verbalized understanding;Returned demonstration          PT Short Term Goals - 04/02/16 1054      PT SHORT TERM GOAL #1   Title Pt will demo consistency and independence with her HEP, to improve BLE strength and mobility  Time 2   Period Weeks   Status New     PT SHORT TERM GOAL #2   Title Pt will demo improved trunk strength and stability evident by her ability to reach 46-75 degrees from the table before tilting of the pelvis during straight leg lower testing.   Time 3   Period Weeks   Status New     PT SHORT TERM GOAL #3   Title Pt will report walking atleast 20 min, daily, to improve overall fitness and activity tolerance for return to work.    Time 3   Period Weeks   Status New           PT Long Term Goals - 04/02/16 1059      PT LONG TERM GOAL #1   Title Pt will demo improved BLE strength to atleast 4+/5 MMT to improve her safety with functional tasks.    Time 6   Period Weeks   Status New     PT LONG TERM GOAL #2   Title Pt will demo improved hip flexor flexibility evident by negative thomas  test on BLE, to improve standing posture.    Time 6   Period Weeks   Status New     PT LONG TERM GOAL #3   Title Pt will demo improved functional strength and endurance evident by her ability to perform 5x sit to stand in less than 10 sec.    Time 6   Period Weeks   Status New     PT LONG TERM GOAL #4   Title Pt will lift no more than 10# box from various heights, demonstrating proper technique and with cues from the therapist no more than 25% of the time, to allow her to safely perform tasks at work.    Time 6   Period Weeks   Status New               Plan - 04/04/16 0981    Clinical Impression Statement Pt arrived today having been very proactive about performing her HEP and walking 20-30 minutes each day. Session began with review of HEP, to ensure proper technique is being used. She was able to perform both exercises with minimal cues for proper technique and additional exercises were provided to further address hip/trunk strength. Pt initially with difficulty activating deep abdominals, but this improved some by the end of the session. No increase in pain reported and pt eager to begin her updated HEP.   Rehab Potential Good   PT Frequency 2x / week   PT Duration 6 weeks   PT Treatment/Interventions ADLs/Self Care Home Management;Moist Heat;Gait training;Functional mobility training;Therapeutic activities;Therapeutic exercise;Patient/family education;Neuromuscular re-education;Manual techniques;Dry needling;Passive range of motion   PT Next Visit Plan ab set progress to dead bug if able, ab set straight leg ext/flexion, hip abduction   PT Home Exercise Plan supine hip flexor stretch, lower trunk rotation at 90 deg, bridge with blue TB, ab set with bent knee lower from step   Consulted and Agree with Plan of Care Patient      Patient will benefit from skilled therapeutic intervention in order to improve the following deficits and impairments:  Decreased activity tolerance,  Decreased endurance, Decreased mobility, Decreased range of motion, Hypomobility, Impaired flexibility, Pain, Postural dysfunction, Improper body mechanics, Increased muscle spasms  Visit Diagnosis: Chronic low back pain, unspecified back pain laterality, with sciatica presence unspecified  Muscle weakness (generalized)  Other symptoms and signs involving the musculoskeletal system  Problem List Patient Active Problem List   Diagnosis Date Noted  . Back pain 01/25/2016  . Hip pain 01/25/2016  . S/P knee surgery 04/20/2013  . Nausea alone 04/20/2013  . CLOSED FRACTURE OF PATELLA 02/06/2009    10:10 AM,04/04/16 Marylyn Ishihara PT, DPT [facility] [phone]  [facility] [address] [place], [place], [address] Phone: [phone]   Fax:  [phone]  Name: Sierra Browning MRN: [id number] Date of Birth: [birth year]

## 2016-04-09 ENCOUNTER — Ambulatory Visit (HOSPITAL_COMMUNITY): Payer: BLUE CROSS/BLUE SHIELD | Admitting: Physical Therapy

## 2016-04-09 ENCOUNTER — Telehealth (INDEPENDENT_AMBULATORY_CARE_PROVIDER_SITE_OTHER): Payer: Self-pay | Admitting: Internal Medicine

## 2016-04-09 DIAGNOSIS — R29898 Other symptoms and signs involving the musculoskeletal system: Secondary | ICD-10-CM | POA: Diagnosis not present

## 2016-04-09 DIAGNOSIS — G8929 Other chronic pain: Secondary | ICD-10-CM | POA: Diagnosis not present

## 2016-04-09 DIAGNOSIS — M6281 Muscle weakness (generalized): Secondary | ICD-10-CM | POA: Diagnosis not present

## 2016-04-09 DIAGNOSIS — M545 Low back pain: Secondary | ICD-10-CM | POA: Diagnosis not present

## 2016-04-09 NOTE — Therapy (Signed)
[facility] [address] [place], [place], [address] Phone: [phone]   Fax:  [phone]  Physical Therapy Treatment  Patient Details  Name: Sierra PionSandra G Hechler MRN: [id number] Date of Birth: December 24, 1955 Referring Provider: Fuller CanadaStanley Harrison, MD  Encounter Date: 04/09/2016      PT End of Session - 04/09/16 1511    Visit Number 3   Number of Visits 13   Date for PT Re-Evaluation 04/23/16   Authorization Type BCBS   Authorization Time Period 04/02/16 to 04/16/16   PT Start Time 1433   PT Stop Time 1511   PT Time Calculation (min) 38 min   Activity Tolerance Patient tolerated treatment well   Behavior During Therapy [facility] for tasks assessed/performed      Past Medical History:  Diagnosis Date  . Back pain   . Hip pain    rt    Past Surgical History:  Procedure Laterality Date  . PATELLA FRACTURE SURGERY      There were no vitals filed for this visit.      Subjective Assessment - 04/09/16 1435    Subjective Patient arrives stating that she is doing well, she is really keeping up with her HEP and has been religiously working on her walking program. Nothing major going on. She exercised earlier this morning.    Pertinent History Patella fx several years ago.    Patient Stated Goals decrease and get rid of pain    Currently in Pain? No/denies  "doesn't even registering, just telling me its there"                          [facility] Adult PT Treatment/Exercise - 04/09/16 0001      Knee/Hip Exercises: Stretches   Hip Flexor Stretch Both;3 reps;30 seconds   Hip Flexor Stretch Limitations thomas position    Other Knee/Hip Stretches low trunk rotations 5x10 seconds; hip adductor stretches 2x30 seconds    Other Knee/Hip Stretches QL stretch 3x15 each side, supine      Knee/Hip Exercises: Supine   Bridges Limitations 2x15, cues for core set    Bridges with Clamshell Both;2 sets;15 reps  blue TB    Straight Leg Raises  Both;1 set;15 reps   Other Supine Knee/Hip Exercises ab set with modified  toe touch crunch motion 1xx15   Other Supine Knee/Hip Exercises reverse crunches 1x15   cues for form      Knee/Hip Exercises: Sidelying   Hip ABduction Both;1 set;15 reps   Hip ABduction Limitations cues for form      Knee/Hip Exercises: Prone   Hip Extension Both;1 set;10 reps   Hip Extension Limitations cues for form       Quad and hip adductor stretches 2x30 seconds each           PT Education - 04/09/16 1511    Education provided No          PT Short Term Goals - 04/02/16 1054      PT SHORT TERM GOAL #1   Title Pt will demo consistency and independence with her HEP, to improve BLE strength and mobility   Time 2   Period Weeks   Status New     PT SHORT TERM GOAL #2   Title Pt will demo improved trunk strength and stability evident by her ability to reach 46-75 degrees from the table before tilting of the pelvis during straight leg lower testing.  Time 3   Period Weeks   Status New     PT SHORT TERM GOAL #3   Title Pt will report walking atleast 20 min, daily, to improve overall fitness and activity tolerance for return to work.    Time 3   Period Weeks   Status New           PT Long Term Goals - 04/02/16 1059      PT LONG TERM GOAL #1   Title Pt will demo improved BLE strength to atleast 4+/5 MMT to improve her safety with functional tasks.    Time 6   Period Weeks   Status New     PT LONG TERM GOAL #2   Title Pt will demo improved hip flexor flexibility evident by negative thomas test on BLE, to improve standing posture.    Time 6   Period Weeks   Status New     PT LONG TERM GOAL #3   Title Pt will demo improved functional strength and endurance evident by her ability to perform 5x sit to stand in less than 10 sec.    Time 6   Period Weeks   Status New     PT LONG TERM GOAL #4   Title Pt will lift no more than 10# box from various heights, demonstrating proper  technique and with cues from the therapist no more than 25% of the time, to allow her to safely perform tasks at work.    Time 6   Period Weeks   Status New               Plan - 04/09/16 1512    Clinical Impression Statement Began session with functional stretching and then proceeded to perform hip and core strengthening as indicated in POC today. Patient able to perform all exercises with good form but did require verbal cues for correct performance intermittently throughout session. Noted hip/pelvis rigidity as evidenced by difficulty with movement patterns with attempted hip extension and related compensations today.    Rehab Potential Good   PT Frequency 2x / week   PT Duration 6 weeks   PT Treatment/Interventions ADLs/Self Care Home Management;Moist Heat;Gait training;Functional mobility training;Therapeutic activities;Therapeutic exercise;Patient/family education;Neuromuscular re-education;Manual techniques;Dry needling;Passive range of motion   PT Next Visit Plan ab set progress to dead bug if able, ab set straight leg ext/flexion, hip abduction   PT Home Exercise Plan supine hip flexor stretch, lower trunk rotation at 90 deg, bridge with blue TB, ab set with bent knee lower from step   Consulted and Agree with Plan of Care Patient      Patient will benefit from skilled therapeutic intervention in order to improve the following deficits and impairments:  Decreased activity tolerance, Decreased endurance, Decreased mobility, Decreased range of motion, Hypomobility, Impaired flexibility, Pain, Postural dysfunction, Improper body mechanics, Increased muscle spasms  Visit Diagnosis: Chronic low back pain, unspecified back pain laterality, with sciatica presence unspecified  Muscle weakness (generalized)  Other symptoms and signs involving the musculoskeletal system     Problem List Patient Active Problem List   Diagnosis Date Noted  . Back pain 01/25/2016  . Hip pain  01/25/2016  . S/P knee surgery 04/20/2013  . Nausea alone 04/20/2013  . CLOSED FRACTURE OF PATELLA 02/06/2009    Nedra Hai PT, DPT [phone]  [facility] [address] [place], [place], [address] Phone: [phone]   Fax:  [phone]  Name: EMMOGENE SIMSON MRN:  [id number] Date of Birth: 1956-02-26

## 2016-04-09 NOTE — Telephone Encounter (Signed)
Patient called and stated that she wanted to let us know that she can't go for her labs because she has so many medical bills right now and is also in therapy.  [phone]

## 2016-04-10 NOTE — Telephone Encounter (Signed)
noted 

## 2016-04-11 ENCOUNTER — Ambulatory Visit (HOSPITAL_COMMUNITY): Payer: BLUE CROSS/BLUE SHIELD | Attending: Orthopedic Surgery | Admitting: Physical Therapy

## 2016-04-11 DIAGNOSIS — R29898 Other symptoms and signs involving the musculoskeletal system: Secondary | ICD-10-CM | POA: Diagnosis not present

## 2016-04-11 DIAGNOSIS — M6281 Muscle weakness (generalized): Secondary | ICD-10-CM

## 2016-04-11 DIAGNOSIS — M545 Low back pain: Secondary | ICD-10-CM | POA: Diagnosis not present

## 2016-04-11 DIAGNOSIS — G8929 Other chronic pain: Secondary | ICD-10-CM | POA: Diagnosis not present

## 2016-04-11 NOTE — Therapy (Signed)
[facility] [address] [place], [place], [address] Phone: [phone]   Fax:  [phone]  Physical Therapy Treatment  Patient Details  Name: Sierra Browning MRN: [id number] Date of Birth: 04-02-1955 Referring Provider: Fuller CanadaStanley Harrison, MD  Encounter Date: 04/11/2016      PT End of Session - 04/11/16 0941    Visit Number 4   Number of Visits 13   Date for PT Re-Evaluation 04/23/16   Authorization Type BCBS   Authorization Time Period 04/02/16 to 05/14/16   PT Start Time 0900   PT Stop Time 0941   PT Time Calculation (min) 41 min   Activity Tolerance Patient tolerated treatment well   Behavior During Therapy [facility] for tasks assessed/performed      Past Medical History:  Diagnosis Date  . Back pain   . Hip pain    rt    Past Surgical History:  Procedure Laterality Date  . PATELLA FRACTURE SURGERY      There were no vitals filed for this visit.      Subjective Assessment - 04/11/16 0902    Subjective Pt reports that things continue to go well. She was not able to walk yesterday but she did do her exercises. No complaints currently.    Pertinent History Patella fx several years ago.    Patient Stated Goals decrease and get rid of pain    Currently in Pain? No/denies                         [facility] Treatment/Exercise - 04/11/16 0001      Knee/Hip Exercises: Stretches   Hip Flexor Stretch Both;3 reps;30 seconds   Hip Flexor Stretch Limitations thomas position with over pressure    Other Knee/Hip Stretches hip adductor stretch, 2x30 sec each    Other Knee/Hip Stretches standing Rt glute max stretch 2x30 sec      Knee/Hip Exercises: Standing   Wall Squat 2 sets;10 reps     Knee/Hip Exercises: Seated   Other Seated Knee/Hip Exercises seated trunk rotation with green TB 2x15 reps each  Rt rotation initial set with red TB, increased to green 2nd      Knee/Hip Exercises: Supine   Other Supine  Knee/Hip Exercises dead bug: LE only x5 reps each, UE only x5 reps each, attempted UE/LE movements x5 reps each and pt requiring several attempts (verbal/visual cues)     Knee/Hip Exercises: Sidelying   Hip ABduction 2 sets;10 reps;Both   Hip ABduction Limitations against wall for technique and glute activation                   PT Short Term Goals - 04/02/16 1054      PT SHORT TERM GOAL #1   Title Pt will demo consistency and independence with her HEP, to improve BLE strength and mobility   Time 2   Period Weeks   Status New     PT SHORT TERM GOAL #2   Title Pt will demo improved trunk strength and stability evident by her ability to reach 46-75 degrees from the table before tilting of the pelvis during straight leg lower testing.   Time 3   Period Weeks   Status New     PT SHORT TERM GOAL #3   Title Pt will report walking atleast 20 min, daily, to improve overall fitness and activity tolerance for return to work.    Time 3  Period Weeks   Status New           PT Long Term Goals - 04/02/16 1059      PT LONG TERM GOAL #1   Title Pt will demo improved BLE strength to atleast 4+/5 MMT to improve her safety with functional tasks.    Time 6   Period Weeks   Status New     PT LONG TERM GOAL #2   Title Pt will demo improved hip flexor flexibility evident by negative thomas test on BLE, to improve standing posture.    Time 6   Period Weeks   Status New     PT LONG TERM GOAL #3   Title Pt will demo improved functional strength and endurance evident by her ability to perform 5x sit to stand in less than 10 sec.    Time 6   Period Weeks   Status New     PT LONG TERM GOAL #4   Title Pt will lift no more than 10# box from various heights, demonstrating proper technique and with cues from the therapist no more than 25% of the time, to allow her to safely perform tasks at work.    Time 6   Period Weeks   Status New               Plan - 04/11/16 0943     Clinical Impression Statement Pt continues to consistently perform her HEP at home and demonstrates eagerness to make progress towards her goals. She was able to perform upright trunk strengthening activity this session without increase in pain, and 2 new exercises were added to her HEP with pt able to demonstrate proper technique. Ended today's session without increase in pain.   Rehab Potential Good   PT Frequency 2x / week   PT Duration 6 weeks   PT Treatment/Interventions ADLs/Self Care Home Management;Moist Heat;Gait training;Functional mobility training;Therapeutic activities;Therapeutic exercise;Patient/family education;Neuromuscular re-education;Manual techniques;Dry needling;Passive range of motion   PT Next Visit Plan seated trunk strengthening, dead bug with UE/LE movements, standing free squat    PT Home Exercise Plan supine hip flexor stretch, lower trunk rotation at 90 deg, bridge with blue TB, ab set with bent knee lower from step   Recommended Other Services none    Consulted and Agree with Plan of Care Patient      Patient will benefit from skilled therapeutic intervention in order to improve the following deficits and impairments:  Decreased activity tolerance, Decreased endurance, Decreased mobility, Decreased range of motion, Hypomobility, Impaired flexibility, Pain, Postural dysfunction, Improper body mechanics, Increased muscle spasms  Visit Diagnosis: Chronic low back pain, unspecified back pain laterality, with sciatica presence unspecified  Muscle weakness (generalized)  Other symptoms and signs involving the musculoskeletal system     Problem List Patient Active Problem List   Diagnosis Date Noted  . Back pain 01/25/2016  . Hip pain 01/25/2016  . S/P knee surgery 04/20/2013  . Nausea alone 04/20/2013  . CLOSED FRACTURE OF PATELLA 02/06/2009   10:15 AM,04/11/16 Marylyn Ishihara PT, DPT [facility] [phone]  [facility] [address] [place], [place], [address] Phone: [phone]   Fax:  [phone]  Name: Sierra Browning MRN: [id number] Date of Birth: [birth year]

## 2016-04-16 ENCOUNTER — Ambulatory Visit (HOSPITAL_COMMUNITY): Payer: BLUE CROSS/BLUE SHIELD | Admitting: Physical Therapy

## 2016-04-16 DIAGNOSIS — M545 Low back pain: Principal | ICD-10-CM

## 2016-04-16 DIAGNOSIS — G8929 Other chronic pain: Secondary | ICD-10-CM | POA: Diagnosis not present

## 2016-04-16 DIAGNOSIS — R29898 Other symptoms and signs involving the musculoskeletal system: Secondary | ICD-10-CM

## 2016-04-16 DIAGNOSIS — M6281 Muscle weakness (generalized): Secondary | ICD-10-CM | POA: Diagnosis not present

## 2016-04-16 NOTE — Therapy (Signed)
[facility] [address] [place], [place], [address] Phone: [phone]   Fax:  [phone]  Physical Therapy Treatment  Patient Details  Name: Sierra Browning MRN: [id number] Date of Birth: 1955/04/04 Referring Provider: Fuller CanadaStanley Harrison, MD  Encounter Date: 04/16/2016      PT End of Session - 04/16/16 0943    Visit Number 5   Number of Visits 13   Date for PT Re-Evaluation 04/23/16   Authorization Type BCBS   Authorization Time Period 04/02/16 to 05/14/16   PT Start Time 0902   PT Stop Time 0942   PT Time Calculation (min) 40 min   Activity Tolerance Patient tolerated treatment well   Behavior During Therapy [facility] for tasks assessed/performed      Past Medical History:  Diagnosis Date  . Back pain   . Hip pain    rt    Past Surgical History:  Procedure Laterality Date  . PATELLA FRACTURE SURGERY      There were no vitals filed for this visit.      Subjective Assessment - 04/16/16 0911    Subjective Pt states she currently has no pain.  Reports complaince with HEP   Currently in Pain? No/denies                         [facility] Adult PT Treatment/Exercise - 04/16/16 0001      Knee/Hip Exercises: Stretches   Hip Flexor Stretch Both;3 reps;30 seconds   Hip Flexor Stretch Limitations thomas position with over pressure    Piriformis Stretch 2 reps;30 seconds   Piriformis Stretch Limitations seated   Other Knee/Hip Stretches hip adductor stretch, 2x30 sec each      Knee/Hip Exercises: Standing   Wall Squat 2 sets;10 reps     Knee/Hip Exercises: Supine   Bridges Limitations 2x15, cues for core set    Straight Leg Raises Both;15 reps;2 sets     Knee/Hip Exercises: Sidelying   Hip ABduction 2 sets;10 reps;Both     Knee/Hip Exercises: Prone   Hip Extension Both;1 set;10 reps   Hip Extension Limitations cues for form                   PT Short Term Goals - 04/02/16 1054      PT SHORT TERM  GOAL #1   Title Pt will demo consistency and independence with her HEP, to improve BLE strength and mobility   Time 2   Period Weeks   Status New     PT SHORT TERM GOAL #2   Title Pt will demo improved trunk strength and stability evident by her ability to reach 46-75 degrees from the table before tilting of the pelvis during straight leg lower testing.   Time 3   Period Weeks   Status New     PT SHORT TERM GOAL #3   Title Pt will report walking atleast 20 min, daily, to improve overall fitness and activity tolerance for return to work.    Time 3   Period Weeks   Status New           PT Long Term Goals - 04/02/16 1059      PT LONG TERM GOAL #1   Title Pt will demo improved BLE strength to atleast 4+/5 MMT to improve her safety with functional tasks.    Time 6   Period Weeks   Status New     PT  LONG TERM GOAL #2   Title Pt will demo improved hip flexor flexibility evident by negative thomas test on BLE, to improve standing posture.    Time 6   Period Weeks   Status New     PT LONG TERM GOAL #3   Title Pt will demo improved functional strength and endurance evident by her ability to perform 5x sit to stand in less than 10 sec.    Time 6   Period Weeks   Status New     PT LONG TERM GOAL #4   Title Pt will lift no more than 10# box from various heights, demonstrating proper technique and with cues from the therapist no more than 25% of the time, to allow her to safely perform tasks at work.    Time 6   Period Weeks   Status New               Plan - 04/16/16 1610    Clinical Impression Statement continued with progression toward goals.  Pt able to complete all exercises, however continues to require cues, specifically to slow down movements and isolate correct musculature.  Pt with discomfort reported in Rt posterior pelvis but able to relieve this with stretching.  Pt wtihout c/o pain or increased discomfort at end of session today.    Rehab Potential Good    PT Frequency 2x / week   PT Duration 6 weeks   PT Treatment/Interventions ADLs/Self Care Home Management;Moist Heat;Gait training;Functional mobility training;Therapeutic activities;Therapeutic exercise;Patient/family education;Neuromuscular re-education;Manual techniques;Dry needling;Passive range of motion   PT Next Visit Plan seated trunk strengthening, dead bug with UE/LE movements, standing free squat    PT Home Exercise Plan supine hip flexor stretch, lower trunk rotation at 90 deg, bridge with blue TB, ab set with bent knee lower from step   Consulted and Agree with Plan of Care Patient      Patient will benefit from skilled therapeutic intervention in order to improve the following deficits and impairments:  Decreased activity tolerance, Decreased endurance, Decreased mobility, Decreased range of motion, Hypomobility, Impaired flexibility, Pain, Postural dysfunction, Improper body mechanics, Increased muscle spasms  Visit Diagnosis: Chronic low back pain, unspecified back pain laterality, with sciatica presence unspecified  Muscle weakness (generalized)  Other symptoms and signs involving the musculoskeletal system     Problem List Patient Active Problem List   Diagnosis Date Noted  . Back pain 01/25/2016  . Hip pain 01/25/2016  . S/P knee surgery 04/20/2013  . Nausea alone 04/20/2013  . CLOSED FRACTURE OF PATELLA 02/06/2009    Lurena Nida, PTA/CLT [phone]  04/16/2016, 9:46 AM  [facility] [address] [place], [place], [address] Phone: [phone]   Fax:  [phone]  Name: Sierra Browning MRN: [id number] Date of Birth: 1955-07-12

## 2016-04-18 ENCOUNTER — Ambulatory Visit (HOSPITAL_COMMUNITY): Payer: BLUE CROSS/BLUE SHIELD | Admitting: Physical Therapy

## 2016-04-18 DIAGNOSIS — M545 Low back pain: Secondary | ICD-10-CM | POA: Diagnosis not present

## 2016-04-18 DIAGNOSIS — R29898 Other symptoms and signs involving the musculoskeletal system: Secondary | ICD-10-CM | POA: Diagnosis not present

## 2016-04-18 DIAGNOSIS — G8929 Other chronic pain: Secondary | ICD-10-CM | POA: Diagnosis not present

## 2016-04-18 DIAGNOSIS — M6281 Muscle weakness (generalized): Secondary | ICD-10-CM

## 2016-04-18 NOTE — Therapy (Signed)
[facility] [place] [facility] [address] [place], [place], [address] Phone: [phone]   Fax:  [phone]  Physical Therapy Treatment  Patient Details  Name: Sierra PionSandra G Oplinger MRN: [id number] Date of Birth: 1955-04-22 Referring Provider: Fuller CanadaStanley Harrison, MD  Encounter Date: 04/18/2016      PT End of Session - 04/18/16 1010    Visit Number 6   Number of Visits 13   Date for PT Re-Evaluation 05/14/16   Authorization Type BCBS   Authorization Time Period 04/02/16 to 05/14/16   PT Start Time 0901   PT Stop Time 0941   PT Time Calculation (min) 40 min   Activity Tolerance Patient tolerated treatment well   Behavior During Therapy [facility] tasks assessed/performed      Past Medical History:  Diagnosis Date  . Back pain   . Hip pain    rt    Past Surgical History:  Procedure Laterality Date  . PATELLA FRACTURE SURGERY      There were no vitals filed for this visit.      Subjective Assessment - 04/18/16 0905    Subjective Pt reports that she was walking up to 3 hours earlier this week. She will have minor aching after maybe 30 minutes but this is not enough to prevent her from walking further. She feels that she is about 80% improved overall.    How long can you walk comfortably? 1 hour   Currently in Pain? No/denies            [facility] Assessment - 04/18/16 0001      Assessment   Medical Diagnosis Lumbar spondylosis   Referring Provider Fuller CanadaStanley Harrison, MD   Onset Date/Surgical Date 08/01/15  approx   Next MD Visit 05/07/16   Prior Therapy none      Prior Function   Level of Independence Independent   Vocation Other (comment)  currently on leave, working 30hr/week prior to this     Cognition   Overall Cognitive Status Within Functional Limits for tasks assessed     Observation/Other Assessments   Observations       Sensation   Light Touch Not tested     Posture/Postural Control   Posture/Postural Control Postural  limitations   Postural Limitations Posterior pelvic tilt;Rounded Shoulders     AROM   Lumbar Flexion --   Lumbar Extension --     Strength       Right Hip Flexion 4/5   Right Hip Extension 4/5  limited by ROM   Right Hip ABduction 4+/5   Left Hip Flexion 4/5   Left Hip Extension 4/5  limited by ROM   Left Hip ABduction 5/5   Right Knee Flexion 4/5   Right Knee Extension 5/5   Left Knee Flexion 4/5   Left Knee Extension 5/5                                                             Transfers   Five time sit to stand comments  7.56 sec, no UE                      [facility] Adult PT Treatment/Exercise - 04/18/16 0001      Therapeutic Activites    Therapeutic  Activities Lifting   Lifting lifting a small box from the ground with verbal/visual demonstration of proper technique. Then practiced lifting from waist height and lowering to the floor, x reps each.      Knee/Hip Exercises: Stretches   Piriformis Stretch Both;3 reps;30 seconds   Piriformis Stretch Limitations seated      Knee/Hip Exercises: Standing   Other Standing Knee Exercises standing UE press down with red TB with hold during exhale 2x15 reps; alternating with step up oppostie UE/LE knee drive 0J81 reps      Knee/Hip Exercises: Seated   Other Seated Knee/Hip Exercises seated trunk rotation 2x15 each with blue TB     Knee/Hip Exercises: Supine   Other Supine Knee/Hip Exercises dead bug x10 reps each                 PT Education - 04/18/16 1008    Education provided Yes   Education Details lifting mechanics and noted improvements in strength and activity tolerance; updated HEP    Person(s) Educated Patient   Methods Explanation;Demonstration;Verbal cues;Handout   Comprehension Verbalized understanding;Returned demonstration          PT Short Term Goals - 04/02/16 1054      PT SHORT TERM GOAL #1   Title Pt will demo consistency and independence with her HEP, to  improve BLE strength and mobility   Time 2   Period Weeks   Status New     PT SHORT TERM GOAL #2   Title Pt will demo improved trunk strength and stability evident by her ability to reach 46-75 degrees from the table before tilting of the pelvis during straight leg lower testing.   Time 3   Period Weeks   Status New     PT SHORT TERM GOAL #3   Title Pt will report walking atleast 20 min, daily, to improve overall fitness and activity tolerance for return to work.    Time 3   Period Weeks   Status New           PT Long Term Goals - 04/02/16 1059      PT LONG TERM GOAL #1   Title Pt will demo improved BLE strength to atleast 4+/5 MMT to improve her safety with functional tasks.    Time 6   Period Weeks   Status New     PT LONG TERM GOAL #2   Title Pt will demo improved hip flexor flexibility evident by negative thomas test on BLE, to improve standing posture.    Time 6   Period Weeks   Status New     PT LONG TERM GOAL #3   Title Pt will demo improved functional strength and endurance evident by her ability to perform 5x sit to stand in less than 10 sec.    Time 6   Period Weeks   Status New     PT LONG TERM GOAL #4   Title Pt will lift no more than 10# box from various heights, demonstrating proper technique and with cues from the therapist no more than 25% of the time, to allow her to safely perform tasks at work.    Time 6   Period Weeks   Status New               Plan - 04/18/16 1015    Clinical Impression Statement Pt continues to make progress towards all goals with report of walking tolerance improved to ~1 hour currently. She continues  to be highly motivated to perform her HEP at home and feels she is ~80% improved. We discussed her planned return to work in the next couple of weeks and began more upright trunk strengthening. Also educated pt on proper lifting mechanics and importance of using correct technique to take stress off of her low back. Pt able  to return demonstration by the end of the session.    Rehab Potential Good   PT Frequency 2x / week   PT Duration 6 weeks   PT Treatment/Interventions ADLs/Self Care Home Management;Moist Heat;Gait training;Functional mobility training;Therapeutic activities;Therapeutic exercise;Patient/family education;Neuromuscular re-education;Manual techniques;Dry needling;Passive range of motion   PT Next Visit Plan decreased frequency to 1x/week next visit!! review lifting technique for retention from previous session; standing and seated trunk strengthening; functional hip flexor stretching   PT Home Exercise Plan supine hip flexor stretch, lower trunk rotation at 90 deg, bridge with blue TB, ab set with bent knee lower from step; seated piriformis stretch    Consulted and Agree with Plan of Care Patient      Patient will benefit from skilled therapeutic intervention in order to improve the following deficits and impairments:  Decreased activity tolerance, Decreased endurance, Decreased mobility, Decreased range of motion, Hypomobility, Impaired flexibility, Pain, Postural dysfunction, Improper body mechanics, Increased muscle spasms  Visit Diagnosis: Chronic low back pain, unspecified back pain laterality, with sciatica presence unspecified  Muscle weakness (generalized)  Other symptoms and signs involving the musculoskeletal system     Problem List Patient Active Problem List   Diagnosis Date Noted  . Back pain 01/25/2016  . Hip pain 01/25/2016  . S/P knee surgery 04/20/2013  . Nausea alone 04/20/2013  . CLOSED FRACTURE OF PATELLA 02/06/2009    10:30 AM,04/18/16 Marylyn Ishihara PT, DPT [facility] [phone]   [facility] [address] [place], [place], [address] Phone: [phone]   Fax:  [phone]  Name: ALISE CALAIS MRN: [id number] Date of Birth: Mar 29, 1955

## 2016-04-23 ENCOUNTER — Ambulatory Visit (HOSPITAL_COMMUNITY): Payer: BLUE CROSS/BLUE SHIELD | Admitting: Physical Therapy

## 2016-04-23 DIAGNOSIS — G8929 Other chronic pain: Secondary | ICD-10-CM

## 2016-04-23 DIAGNOSIS — M545 Low back pain: Secondary | ICD-10-CM

## 2016-04-23 DIAGNOSIS — M6281 Muscle weakness (generalized): Secondary | ICD-10-CM | POA: Diagnosis not present

## 2016-04-23 DIAGNOSIS — R29898 Other symptoms and signs involving the musculoskeletal system: Secondary | ICD-10-CM

## 2016-04-23 NOTE — Therapy (Signed)
[facility] [address] [place], [place], [address] Phone: [phone]   Fax:  [phone]  Physical Therapy Treatment  Patient Details  Name: Sierra Browning MRN: [id number] Date of Birth: 1955-10-17 Referring Provider: Fuller Canada, MD  Encounter Date: 04/23/2016      PT End of Session - 04/23/16 1009    Visit Number 7   Number of Visits 13   Date for PT Re-Evaluation 05/14/16   Authorization Type BCBS   Authorization Time Period 04/02/16 to 05/14/16   PT Start Time 0816   PT Stop Time 0856   PT Time Calculation (min) 40 min   Activity Tolerance Patient tolerated treatment well;No increased pain   Behavior During Therapy WFL for tasks assessed/performed      Past Medical History:  Diagnosis Date  . Back pain   . Hip pain    rt    Past Surgical History:  Procedure Laterality Date  . PATELLA FRACTURE SURGERY      There were no vitals filed for this visit.      Subjective Assessment - 04/23/16 0817    Subjective Pt reports things are going well. She has some soreness in her Rt hip/low back currently. No other complaints.    How long can you walk comfortably? 1 hour   Currently in Pain? No/denies                         [facility] Adult PT Treatment/Exercise - 04/23/16 0001      Knee/Hip Exercises: Stretches   Hip Flexor Stretch 3 reps;Both;30 seconds   Hip Flexor Stretch Limitations standing    Piriformis Stretch 4 reps;30 seconds   Piriformis Stretch Limitations seated    Other Knee/Hip Stretches Rt glute max stretch 3x20 sec      Knee/Hip Exercises: Standing   Forward Step Up Both;1 set;15 reps;Hand Hold: 1;Step Height: 6"   Forward Step Up Limitations with knee drive    Functional Squat 2 sets;10 reps   Functional Squat Limitations weight shift Lt, heavy tactile  cues for improved technique    Other Standing Knee Exercises sidestepping 3x24ft  with red TB around ankles      Manual Therapy    Manual Therapy Soft tissue mobilization   Manual therapy comments separate rest of session   Soft tissue mobilization STM Rt glute med/piriformis/glute max; Rt QL stretch                 PT Education - 04/23/16 1003    Education provided Yes   Education Details importance of proper mechanics with therex   Person(s) Educated Patient   Methods Explanation;Tactile cues;Verbal cues   Comprehension Verbalized understanding;Returned demonstration          PT Short Term Goals - 04/02/16 1054      PT SHORT TERM GOAL #1   Title Pt will demo consistency and independence with her HEP, to improve BLE strength and mobility   Time 2   Period Weeks   Status New     PT SHORT TERM GOAL #2   Title Pt will demo improved trunk strength and stability evident by her ability to reach 46-75 degrees from the table before tilting of the pelvis during straight leg lower testing.   Time 3   Period Weeks   Status New     PT SHORT TERM GOAL #3   Title Pt will report walking atleast 20 min, daily,  to improve overall fitness and activity tolerance for return to work.    Time 3   Period Weeks   Status New           PT Long Term Goals - 04/02/16 1059      PT LONG TERM GOAL #1   Title Pt will demo improved BLE strength to atleast 4+/5 MMT to improve her safety with functional tasks.    Time 6   Period Weeks   Status New     PT LONG TERM GOAL #2   Title Pt will demo improved hip flexor flexibility evident by negative thomas test on BLE, to improve standing posture.    Time 6   Period Weeks   Status New     PT LONG TERM GOAL #3   Title Pt will demo improved functional strength and endurance evident by her ability to perform 5x sit to stand in less than 10 sec.    Time 6   Period Weeks   Status New     PT LONG TERM GOAL #4   Title Pt will lift no more than 10# box from various heights, demonstrating proper technique and with cues from the therapist no more than 25% of the time, to  allow her to safely perform tasks at work.    Time 6   Period Weeks   Status New               Plan - 04/23/16 1009    Clinical Impression Statement Today's session continued with therex to improve trunk/LE strength and flexibility. Performed STM along her Rt hip to decrease muscle spasm and pain. Pt unable to perform BLE squat without noted weight shift to the Lt, and pt required increased verbal/tactile cues to improve this. Encouraged pt to focus on technique rather than repetitions during her exercises at home to ensure she is getting the optimal benefit. She verbalized understanding and would benefit from reinforcement of this concept.    Rehab Potential Good   PT Frequency 2x / week   PT Duration 6 weeks   PT Treatment/Interventions ADLs/Self Care Home Management;Moist Heat;Gait training;Functional mobility training;Therapeutic activities;Therapeutic exercise;Patient/family education;Neuromuscular re-education;Manual techniques;Dry needling;Passive range of motion   PT Next Visit Plan decreased frequency to 1x/week next visit; STM Rt glute/piriformis; hip extensor strength and trunk strength in upright position   PT Home Exercise Plan supine hip flexor stretch, lower trunk rotation at 90 deg, bridge with blue TB, ab set with bent knee lower from step; seated piriformis stretch    Consulted and Agree with Plan of Care Patient      Patient will benefit from skilled therapeutic intervention in order to improve the following deficits and impairments:  Decreased activity tolerance, Decreased endurance, Decreased mobility, Decreased range of motion, Hypomobility, Impaired flexibility, Pain, Postural dysfunction, Improper body mechanics, Increased muscle spasms  Visit Diagnosis: Muscle weakness (generalized)  Other symptoms and signs involving the musculoskeletal system  Chronic low back pain, unspecified back pain laterality, with sciatica presence unspecified     Problem  List Patient Active Problem List   Diagnosis Date Noted  . Back pain 01/25/2016  . Hip pain 01/25/2016  . S/P knee surgery 04/20/2013  . Nausea alone 04/20/2013  . CLOSED FRACTURE OF PATELLA 02/06/2009    12:11 PM,04/23/16 Marylyn IshiharaSara Kiser PT, DPT [facility] [phone]  [facility] [address] [place], [place], [address] Phone: [phone]   Fax:  [phone]  Name: Sierra Browning MRN: [id number] Date of Birth: 17-Dec-1955

## 2016-04-25 ENCOUNTER — Ambulatory Visit (HOSPITAL_COMMUNITY): Payer: BLUE CROSS/BLUE SHIELD | Admitting: Physical Therapy

## 2016-04-25 DIAGNOSIS — R29898 Other symptoms and signs involving the musculoskeletal system: Secondary | ICD-10-CM

## 2016-04-25 DIAGNOSIS — M545 Low back pain: Secondary | ICD-10-CM | POA: Diagnosis not present

## 2016-04-25 DIAGNOSIS — G8929 Other chronic pain: Secondary | ICD-10-CM

## 2016-04-25 DIAGNOSIS — M6281 Muscle weakness (generalized): Secondary | ICD-10-CM | POA: Diagnosis not present

## 2016-04-25 NOTE — Therapy (Signed)
[facility] [address] [place], [place], [address] Phone: [phone]   Fax:  [phone]  Physical Therapy Treatment  Patient Details  Name: Sierra Browning MRN: [id number] Date of Birth: 18-Feb-1956 Referring Provider: Fuller Canada, MD  Encounter Date: 04/25/2016      PT End of Session - 04/25/16 0932    Visit Number 8   Number of Visits 13   Date for PT Re-Evaluation 05/14/16   Authorization Type BCBS   Authorization Time Period 04/02/16 to 05/14/16   PT Start Time 0900   PT Stop Time 0940   PT Time Calculation (min) 40 min   Activity Tolerance Patient tolerated treatment well;No increased pain   Behavior During Therapy WFL for tasks assessed/performed      Past Medical History:  Diagnosis Date  . Back pain   . Hip pain    rt    Past Surgical History:  Procedure Laterality Date  . PATELLA FRACTURE SURGERY      There were no vitals filed for this visit.      Subjective Assessment - 04/25/16 0905    Subjective Pt reports that she did not have any pain last night after her session stating that the manual really helped. She brought a tennis ball for the therapist to show her massage techniques.    How long can you walk comfortably? 1 hour   Currently in Pain? No/denies                         [facility] Adult PT Treatment/Exercise - 04/25/16 0001      Knee/Hip Exercises: Stretches   Hip Flexor Stretch Both;2 reps;60 seconds   Hip Flexor Stretch Limitations supine      Knee/Hip Exercises: Standing   Forward Step Up 2 sets;Right;Hand Hold: 1;Step Height: 6"   Forward Step Up Limitations x1 set on Lt; opposite knee drive    Other Standing Knee Exercises helf kneel to stand from 4" box with foam pad and BUE support. x15 reps each.  Increased verbal cues needed to prevent pelvic rotation with LLE forward      Knee/Hip Exercises: Supine   Other Supine Knee/Hip Exercises supine ab set with BUE flexion, LE  extension x10 reps. x2 sets       Self glute/piriformis massage with tennis ball x5 min           PT Education - 04/25/16 0930    Education provided Yes   Education Details reviewed self massage techniques with tennis ball; discussed decrease in PT frequency due to noted improvements in function    Person(s) Educated Patient   Methods Explanation;Tactile cues;Verbal cues;Handout   Comprehension Verbalized understanding;Returned demonstration          PT Short Term Goals - 04/02/16 1054      PT SHORT TERM GOAL #1   Title Pt will demo consistency and independence with her HEP, to improve BLE strength and mobility   Time 2   Period Weeks   Status New     PT SHORT TERM GOAL #2   Title Pt will demo improved trunk strength and stability evident by her ability to reach 46-75 degrees from the table before tilting of the pelvis during straight leg lower testing.   Time 3   Period Weeks   Status New     PT SHORT TERM GOAL #3   Title Pt will report walking atleast 20 min, daily, to  improve overall fitness and activity tolerance for return to work.    Time 3   Period Weeks   Status New           PT Long Term Goals - 04/02/16 1059      PT LONG TERM GOAL #1   Title Pt will demo improved BLE strength to atleast 4+/5 MMT to improve her safety with functional tasks.    Time 6   Period Weeks   Status New     PT LONG TERM GOAL #2   Title Pt will demo improved hip flexor flexibility evident by negative thomas test on BLE, to improve standing posture.    Time 6   Period Weeks   Status New     PT LONG TERM GOAL #3   Title Pt will demo improved functional strength and endurance evident by her ability to perform 5x sit to stand in less than 10 sec.    Time 6   Period Weeks   Status New     PT LONG TERM GOAL #4   Title Pt will lift no more than 10# box from various heights, demonstrating proper technique and with cues from the therapist no more than 25% of the time, to  allow her to safely perform tasks at work.    Time 6   Period Weeks   Status New               Plan - 04/25/16 [id number]    Clinical Impression Statement Pt arrived today reporting improved rest last night following her session. She continues to perform her HEP, requiring mostly cues to improve focus on proper technique. Session focused on educating pt on self-massage techniques and set up at home as needed followed with therex to further increase glute strength and activation during activity. Ended session without report of pain and decreased PT frequency down to 1x/week secondary to her growing independence and improvements in function/activity tolerance.   Rehab Potential Good   PT Frequency 2x / week   PT Duration 6 weeks   PT Treatment/Interventions ADLs/Self Care Home Management;Moist Heat;Gait training;Functional mobility training;Therapeutic activities;Therapeutic exercise;Patient/family education;Neuromuscular re-education;Manual techniques;Dry needling;Passive range of motion   PT Next Visit Plan lifting mechanics; half kneel to stand with 4" box and foam focus on glute activation; STM as needed    PT Home Exercise Plan supine hip flexor stretch, lower trunk rotation at 90 deg, bridge with blue TB; seated piriformis stretch, trigger point release with tennis ball; seated marching on physioball    Recommended Other Services none    Consulted and Agree with Plan of Care Patient      Patient will benefit from skilled therapeutic intervention in order to improve the following deficits and impairments:  Decreased activity tolerance, Decreased endurance, Decreased mobility, Decreased range of motion, Hypomobility, Impaired flexibility, Pain, Postural dysfunction, Improper body mechanics, Increased muscle spasms  Visit Diagnosis: Muscle weakness (generalized)  Other symptoms and signs involving the musculoskeletal system  Chronic low back pain, unspecified back pain laterality, with  sciatica presence unspecified     Problem List Patient Active Problem List   Diagnosis Date Noted  . Back pain 01/25/2016  . Hip pain 01/25/2016  . S/P knee surgery 04/20/2013  . Nausea alone 04/20/2013  . CLOSED FRACTURE OF PATELLA 02/06/2009   11:39 AM,04/25/16 Marylyn IshiharaSara Kiser PT, DPT [facility] [phone]  [facility] [address] [place], [place], [address] Phone: [phone]  Fax:  [phone]  Name: HARLYM GEHLING MRN: [id number] Date of Birth: 08/21/1955

## 2016-04-29 ENCOUNTER — Ambulatory Visit (INDEPENDENT_AMBULATORY_CARE_PROVIDER_SITE_OTHER): Payer: BLUE CROSS/BLUE SHIELD | Admitting: Internal Medicine

## 2016-04-30 ENCOUNTER — Ambulatory Visit (HOSPITAL_COMMUNITY): Payer: BLUE CROSS/BLUE SHIELD

## 2016-05-02 ENCOUNTER — Ambulatory Visit (HOSPITAL_COMMUNITY): Payer: BLUE CROSS/BLUE SHIELD | Admitting: Physical Therapy

## 2016-05-02 DIAGNOSIS — M545 Low back pain: Secondary | ICD-10-CM

## 2016-05-02 DIAGNOSIS — G8929 Other chronic pain: Secondary | ICD-10-CM

## 2016-05-02 DIAGNOSIS — R29898 Other symptoms and signs involving the musculoskeletal system: Secondary | ICD-10-CM | POA: Diagnosis not present

## 2016-05-02 DIAGNOSIS — M6281 Muscle weakness (generalized): Secondary | ICD-10-CM

## 2016-05-02 NOTE — Therapy (Signed)
[facility] [address] [place], [place], [address] Phone: [phone]   Fax:  [phone]  Physical Therapy Treatment  Patient Details  Name: Sierra PionSandra G Bubb MRN: [id number] Date of Birth: [birth year] Referring Provider: Fuller CanadaStanley Harrison, MD  Encounter Date: 05/02/2016      PT End of Session - 05/02/16 0921    Visit Number 9   Number of Visits 13   Date for PT Re-Evaluation 05/14/16   Authorization Type BCBS   Authorization Time Period 04/02/16 to 05/14/16   PT Start Time 0901   PT Stop Time 0942   PT Time Calculation (min) 41 min   Activity Tolerance Patient tolerated treatment well;No increased pain   Behavior During Therapy WFL for tasks assessed/performed      Past Medical History:  Diagnosis Date  . Back pain   . Hip pain    rt    Past Surgical History:  Procedure Laterality Date  . PATELLA FRACTURE SURGERY      There were no vitals filed for this visit.      Subjective Assessment - 05/02/16 0905    Subjective Pt reports things are going well. She has been working on her HEP and only has minor discomfort in her Rt hip. No other complaints or concerns.    How long can you walk comfortably? 1 hour   Currently in Pain? No/denies                         [facility] Adult PT Treatment/Exercise - 05/02/16 0001      Knee/Hip Exercises: Stretches   Hip Flexor Stretch Both;3 reps;30 seconds   Hip Flexor Stretch Limitations standing    Piriformis Stretch 3 reps;30 seconds   Piriformis Stretch Limitations seated      Knee/Hip Exercises: Standing   Knee Flexion Both;2 sets;10 reps   Knee Flexion Limitations 3# ankle weights    Other Standing Knee Exercises half kneel to stand from 4" box and foam pad, BUE support and increased verbal cues, 2x10 reps each; SLS with UE punches with green TB derotation 2x10 each.    Other Standing Knee Exercises UE pressdown with red TB hold with LE march 3x5 reps each      Knee/Hip Exercises: Supine   Other Supine Knee/Hip Exercises BLE bridge hold with alt knee extension x5 reps each, x2 sets     Manual Therapy   Manual Therapy Soft tissue mobilization   Manual therapy comments separate rest of session   Soft tissue mobilization STM Rt QL with passive stretch, SMT Rt piriformis                 PT Education - 05/02/16 1023    Education provided Yes   Education Details discussed upcoming discharge and importance of continuing with exercises/stretches at home/work to address any remaining discomfort during long days; technique with therex    Person(s) Educated Patient   Methods Explanation;Demonstration;Tactile cues;Verbal cues   Comprehension Verbalized understanding;Returned demonstration          PT Short Term Goals - 04/02/16 1054      PT SHORT TERM GOAL #1   Title Pt will demo consistency and independence with her HEP, to improve BLE strength and mobility   Time 2   Period Weeks   Status New     PT SHORT TERM GOAL #2   Title Pt will demo improved trunk strength and stability evident by her ability  to reach 46-75 degrees from the table before tilting of the pelvis during straight leg lower testing.   Time 3   Period Weeks   Status New     PT SHORT TERM GOAL #3   Title Pt will report walking atleast 20 min, daily, to improve overall fitness and activity tolerance for return to work.    Time 3   Period Weeks   Status New           PT Long Term Goals - 04/02/16 1059      PT LONG TERM GOAL #1   Title Pt will demo improved BLE strength to atleast 4+/5 MMT to improve her safety with functional tasks.    Time 6   Period Weeks   Status New     PT LONG TERM GOAL #2   Title Pt will demo improved hip flexor flexibility evident by negative thomas test on BLE, to improve standing posture.    Time 6   Period Weeks   Status New     PT LONG TERM GOAL #3   Title Pt will demo improved functional strength and endurance evident by her  ability to perform 5x sit to stand in less than 10 sec.    Time 6   Period Weeks   Status New     PT LONG TERM GOAL #4   Title Pt will lift no more than 10# box from various heights, demonstrating proper technique and with cues from the therapist no more than 25% of the time, to allow her to safely perform tasks at work.    Time 6   Period Weeks   Status New               Plan - 05/02/16 1026    Clinical Impression Statement Continued this visit with manual techniques to further address soft tissue restrictions and remaining discomfort in pt's glute region and low back. Progressed several functional stability activities with noted improvements in her technique compared to her last session. Discussed upcoming discharge due to her progress and increasing independence with HEP. Pt verbalized agreement with this.    Rehab Potential Good   PT Frequency 2x / week   PT Duration 6 weeks   PT Treatment/Interventions ADLs/Self Care Home Management;Moist Heat;Gait training;Functional mobility training;Therapeutic activities;Therapeutic exercise;Patient/family education;Neuromuscular re-education;Manual techniques;Dry needling;Passive range of motion   PT Next Visit Plan update final HEP with stretching/therex; lifting technique    PT Home Exercise Plan supine hip flexor stretch, lower trunk rotation at 90 deg, bridge with blue TB; seated piriformis stretch, trigger point release with tennis ball; seated marching on physioball    Consulted and Agree with Plan of Care Patient      Patient will benefit from skilled therapeutic intervention in order to improve the following deficits and impairments:     Visit Diagnosis: Muscle weakness (generalized)  Other symptoms and signs involving the musculoskeletal system  Chronic low back pain, unspecified back pain laterality, with sciatica presence unspecified     Problem List Patient Active Problem List   Diagnosis Date Noted  . Back pain  01/25/2016  . Hip pain 01/25/2016  . S/P knee surgery 04/20/2013  . Nausea alone 04/20/2013  . CLOSED FRACTURE OF PATELLA 02/06/2009    10:29 AM,05/02/16 Marylyn Ishihara PT, DPT [facility] [phone]   [facility] [address] [place], [place], [address] Phone: [phone]   Fax:  [phone]  Name: Yesli  PAILYN BELLEVUE MRN: [id number] Date of Birth: [birth year]

## 2016-05-07 ENCOUNTER — Ambulatory Visit (INDEPENDENT_AMBULATORY_CARE_PROVIDER_SITE_OTHER): Payer: BLUE CROSS/BLUE SHIELD | Admitting: Orthopedic Surgery

## 2016-05-07 ENCOUNTER — Ambulatory Visit (HOSPITAL_COMMUNITY): Payer: BLUE CROSS/BLUE SHIELD | Admitting: Physical Therapy

## 2016-05-07 ENCOUNTER — Encounter: Payer: Self-pay | Admitting: Orthopedic Surgery

## 2016-05-07 DIAGNOSIS — M5441 Lumbago with sciatica, right side: Secondary | ICD-10-CM

## 2016-05-07 NOTE — Progress Notes (Signed)
Progress Note   Patient ID: Sierra Browning, female   DOB: 03-10-1956, 61 y.o.   MRN: [id number]  Chief Complaint  Patient presents with  . Follow-up    BACK PAIN S/P ESI AND PT    HPI Sierra Browning is a 61 y.o. female.   HPI 61 year old female status post epidural injection physical therapy for ongoing right-sided lower back pain and right leg sciatica with its major improvement Review of Systems Review of Systems Normal neuro  Denies fever  Examination There were no vitals taken for this visit.  Gen. appearance the patient's appearance is normal with normal grooming and  hygiene The patient is oriented to person place and time Mood and affect are normal   Ortho Exam Stability tests are normal  Motor exam 5/5 manual muscle testing , no atrophy  Skin is normal (no rash or erythema)   Toe-touch test was normal she had no tenderness or pain in the midline of the spine showed mild discomfort on the right side she had normal strength in both lower extremities back alignment was normal Medical decision-making Diagnosis, Data, Plan (risk)  Encounter Diagnosis  Name Primary?  . Bilateral low back pain with right-sided sciatica, unspecified chronicity-Improved  Yes   Released  Fuller CanadaStanley Sohum Delillo, MD 05/07/2016 10:22 AM

## 2016-05-08 ENCOUNTER — Ambulatory Visit: Payer: BLUE CROSS/BLUE SHIELD | Admitting: Orthopedic Surgery

## 2016-05-09 ENCOUNTER — Ambulatory Visit (HOSPITAL_COMMUNITY): Payer: BLUE CROSS/BLUE SHIELD | Attending: Orthopedic Surgery

## 2016-05-09 DIAGNOSIS — G8929 Other chronic pain: Secondary | ICD-10-CM | POA: Diagnosis not present

## 2016-05-09 DIAGNOSIS — M6281 Muscle weakness (generalized): Secondary | ICD-10-CM | POA: Diagnosis not present

## 2016-05-09 DIAGNOSIS — M545 Low back pain: Secondary | ICD-10-CM | POA: Insufficient documentation

## 2016-05-09 DIAGNOSIS — R29898 Other symptoms and signs involving the musculoskeletal system: Secondary | ICD-10-CM

## 2016-05-09 NOTE — Therapy (Signed)
[facility] [address] [place], [place], [address] Phone: [phone]   Fax:  [phone]  Physical Therapy Treatment  Patient Details  Name: Sierra Browning MRN: [id number] Date of Birth: 12-28-1955 Referring Provider: Arther Abbott, MD  Encounter Date: 05/09/2016      PT End of Session - 05/09/16 0918    Visit Number 10   Number of Visits 13   Date for PT Re-Evaluation 05/14/16   Authorization Type BCBS   Authorization Time Period 04/02/16 to 05/14/16   PT Start Time 2248   PT Stop Time 0937   PT Time Calculation (min) 43 min   Activity Tolerance Patient tolerated treatment well;No increased pain   Behavior During Therapy WFL for tasks assessed/performed      Past Medical History:  Diagnosis Date  . Back pain   . Hip pain    rt    Past Surgical History:  Procedure Laterality Date  . PATELLA FRACTURE SURGERY      There were no vitals filed for this visit.      Subjective Assessment - 05/09/16 0854    Subjective Pt reports she is doing well.  Reports compliance with HEP daily without difficulties.     Pertinent History Patella fx several years ago.    Limitations Standing;Walking   How long can you sit comfortably? unlimited in the right positions   How long can you stand comfortably? Able to stand comfortably for 3 hours and feels she could have stood longer (unsure, she has been sitting)   How long can you walk comfortably? 1 hour, feels she could walk further   Diagnostic tests MRI: bulging disc, facet arthritis   Patient Stated Goals decrease and get rid of pain    Currently in Pain? No/denies            [facility] PT Assessment - 05/09/16 0001      Assessment   Medical Diagnosis Lumbar spondylosis   Referring Provider Arther Abbott, MD   Onset Date/Surgical Date 08/01/15   Next MD Visit Released from MD on 02/27   Prior Therapy none      Cognition   Overall Cognitive Status Within Functional Limits  for tasks assessed     Posture/Postural Control   Posture/Postural Control Postural limitations     Strength   Overall Strength Comments Double straight leg lower, able to lower 55 deg with back flat against table   Double straight leg lower, able to lower 20 deg with back fl   Strength Assessment Site Hip;Knee   Right/Left Hip Right;Left   Right Hip Flexion 4+/5  was 4/5   Right Hip Extension 4/5  was 4/5   Right Hip ABduction 5/5  was 4+/5   Left Hip Flexion 4+/5  was 4/5   Left Hip Extension 4/5  was 4/5   Left Hip ABduction 5/5   Right/Left Knee Right;Left   Right Knee Flexion 4+/5  was 4/5   Right Knee Extension 5/5   Left Knee Flexion 4+/5  was 4/5   Left Knee Extension 5/5     Flexibility   Soft Tissue Assessment /Muscle Length yes   Hamstrings WNL   Piriformis Bil 40% limited     Special Tests    Special Tests Hip Special Tests   Hip Special Tests  [facility] Test    Findings Negative   Comments BLE     Transfers   Five time  sit to stand comments  7.17 no UE A  was 7.56" with no HHA                     [facility] Adult PT Treatment/Exercise - 05/09/16 0001      Knee/Hip Exercises: Stretches   Hip Flexor Stretch Both;3 reps;30 seconds   Hip Flexor Stretch Limitations standing    Piriformis Stretch 3 reps;30 seconds   Piriformis Stretch Limitations seated    Other Knee/Hip Stretches QL stretch 3x30"     Knee/Hip Exercises: Standing   Forward Step Up 2 sets;Right;Hand Hold: 1;Step Height: 6"   Forward Step Up Limitations x1 set on Lt; opposite knee drive    Functional Squat 5 reps;3 sets   Functional Squat Limitations weight shift Lt, heavy tactile and verbal cues for improved technique    Other Standing Knee Exercises UE pressdown with GTB hold with LE march 10 reps each                 PT Education - 05/09/16 0956    Education provided Yes   Education Details Discussed discharge and compliance with HEP;  Educated  proper lifting technqiues   Person(s) Educated Patient   Methods Explanation;Demonstration;Tactile cues;Verbal cues;Handout   Comprehension Verbalized understanding;Returned demonstration          PT Short Term Goals - 05/09/16 0918      PT SHORT TERM GOAL #1   Title Pt will demo consistency and independence with her HEP, to improve BLE strength and mobility   Baseline 03/01: Reports complaince iwht HEP daily   Status Achieved     PT SHORT TERM GOAL #2   Title Pt will demo improved trunk strength and stability evident by her ability to reach 46-75 degrees from the table before tilting of the pelvis during straight leg lower testing.   Baseline 03/01: 55 degree from table prior pelvic lifting   Status Achieved     PT SHORT TERM GOAL #3   Title Pt will report walking atleast 20 min, daily, to improve overall fitness and activity tolerance for return to work.    Baseline 03/01: Reports complaincei wth wakling program 2-3 times a day   Status Achieved           PT Long Term Goals - 05/09/16 0920      PT LONG TERM GOAL #1   Title Pt will demo improved BLE strength to atleast 4+/5 MMT to improve her safety with functional tasks.    Status Partially Met     PT LONG TERM GOAL #2   Title Pt will demo improved hip flexor flexibility evident by negative thomas test on BLE, to improve standing posture.    Baseline 03/01: Negative Thomas test BLE   Status Achieved     PT LONG TERM GOAL #3   Title Pt will demo improved functional strength and endurance evident by her ability to perform 5x sit to stand in less than 10 sec.    Baseline 05/09/2016:  7.17" with 5 STS   Status Achieved     PT LONG TERM GOAL #4   Title Pt will lift no more than 10# box from various heights, demonstrating proper technique and with cues from the therapist no more than 25% of the time, to allow her to safely perform tasks at work.    Baseline 03/01 cueing to equalize weight bearing and proper mechanics to  reduce lumbar flexion and improve squat position  Status Partially Met               Plan - 05/09/16 0941    Clinical Impression Statement Reviewed goals this session with positive results.  Pt wtih reports of increased tolerance for standing and gait and reports of pain free, feels she has improved 90%.  Improved core and LE strength noted with MMT.  Improved LE mobilty/flexibilty noted wiht negative Thomas test, does continue to have 40% limitation with piriformis flexibilty.  Additional HEP given to pt with stretches to improve mobilty and core strengthening therex.  Pt able to demonstrate and verbalize appropraite form and technique with new HEP exercises.  Pt does continue to require min cueing to improve lifting mechanics with cueing for proper stance prior lifting and equal weight bearing as tendency to reduce Rt LE weight bearing partially due to weakness and prior injury.   Rehab Potential Good   PT Frequency 2x / week   PT Duration 6 weeks   PT Treatment/Interventions ADLs/Self Care Home Management;Moist Heat;Gait training;Functional mobility training;Therapeutic activities;Therapeutic exercise;Patient/family education;Neuromuscular re-education;Manual techniques;Dry needling;Passive range of motion   PT Next Visit Plan D/C to HEP per PT POC   PT Home Exercise Plan supine hip flexor stretch, lower trunk rotation at 90 deg, bridge with blue TB; seated piriformis stretch, trigger point release with tennis ball; seated marching on physioball; piriformis, QL and hip flexor stretch, theraband hip marching and knee drives on 7in step      Patient will benefit from skilled therapeutic intervention in order to improve the following deficits and impairments:  Decreased activity tolerance, Decreased endurance, Decreased mobility, Decreased range of motion, Hypomobility, Impaired flexibility, Pain, Postural dysfunction, Improper body mechanics, Increased muscle spasms  Visit  Diagnosis: Muscle weakness (generalized)  Other symptoms and signs involving the musculoskeletal system  Chronic low back pain, unspecified back pain laterality, with sciatica presence unspecified     Problem List Patient Active Problem List   Diagnosis Date Noted  . Back pain 01/25/2016  . Hip pain 01/25/2016  . S/P knee surgery 04/20/2013  . Nausea alone 04/20/2013  . CLOSED FRACTURE OF PATELLA 02/06/2009   Ihor Austin, LPTA; CBIS [phone]  Aldona Lento 05/09/2016, 9:57 AM  [facility] [address] [place], [place], [address] Phone: [phone]   Fax:  [phone]  Name: Sierra Browning MRN: [id number] Date of Birth: 16-Apr-1955

## 2016-05-09 NOTE — Patient Instructions (Addendum)
Piriformis Stretch, Sitting    Sit, one ankle on opposite knee, same-side hand on crossed knee. Push down on knee, keeping spine straight. Lean torso forward, with flat back, until tension is felt in hamstrings and gluteals of crossed-leg side. Hold 30 seconds.  Repeat 3 times per session. Do 2 sessions per day.  Copyright  VHI. All rights reserved.    Flexors, Standing    Stand, one foot on chair. Use support for balance, if needed. Slowly shift weight toward raised knee until stretch is felt in quadriceps of standing leg and hamstrings of forward leg. Hold 30 seconds.  Repeat 3 times per session. Do 2 sessions per day.  Copyright  VHI. All rights reserved.   Lower Trunk Rotation Stretch    Keeping back flat and feet together, rotate knees to left side. Hold 30 seconds. Repeat 3 times per set. Do 2 sets per session.  http://orth.exer.us/123   Copyright  VHI. All rights reserved.   (Clinic) Extension / Flexion (Assist)    Face pulley, right arm as far forward and up as is pain free. Pull arm down toward side.   Keeping core tight march and hold 5" Repeat 10 times per set. Do 2 sets per session. .   Copyright  VHI. All rights reserved.   Anterior Step-Up    Stand with 7 inch step placed in A direction. Step onto step with right foot without pushing off with the ground foot and raising opposite knee to marching position. 10-20 sets 1-2 times per day.  http://gglj.exer.us/161   Copyright  VHI. All rights reserved.

## 2017-06-17 DIAGNOSIS — Z789 Other specified health status: Secondary | ICD-10-CM | POA: Diagnosis not present

## 2017-06-17 DIAGNOSIS — Z681 Body mass index (BMI) 19 or less, adult: Secondary | ICD-10-CM | POA: Diagnosis not present

## 2017-06-17 DIAGNOSIS — D72819 Decreased white blood cell count, unspecified: Secondary | ICD-10-CM | POA: Diagnosis not present

## 2017-06-17 DIAGNOSIS — E782 Mixed hyperlipidemia: Secondary | ICD-10-CM | POA: Diagnosis not present

## 2017-06-17 DIAGNOSIS — R7309 Other abnormal glucose: Secondary | ICD-10-CM | POA: Diagnosis not present

## 2017-06-17 DIAGNOSIS — E663 Overweight: Secondary | ICD-10-CM | POA: Diagnosis not present

## 2017-06-17 DIAGNOSIS — Z1389 Encounter for screening for other disorder: Secondary | ICD-10-CM | POA: Diagnosis not present

## 2017-07-08 DIAGNOSIS — Z1389 Encounter for screening for other disorder: Secondary | ICD-10-CM | POA: Diagnosis not present

## 2017-07-08 DIAGNOSIS — Z6825 Body mass index (BMI) 25.0-25.9, adult: Secondary | ICD-10-CM | POA: Diagnosis not present

## 2017-07-08 DIAGNOSIS — E663 Overweight: Secondary | ICD-10-CM | POA: Diagnosis not present

## 2017-07-08 DIAGNOSIS — R945 Abnormal results of liver function studies: Secondary | ICD-10-CM | POA: Diagnosis not present

## 2017-07-08 DIAGNOSIS — R748 Abnormal levels of other serum enzymes: Secondary | ICD-10-CM | POA: Diagnosis not present

## 2017-12-25 DIAGNOSIS — Z23 Encounter for immunization: Secondary | ICD-10-CM | POA: Diagnosis not present

## 2018-11-17 DIAGNOSIS — Z23 Encounter for immunization: Secondary | ICD-10-CM | POA: Diagnosis not present

## 2019-02-09 DIAGNOSIS — Z6828 Body mass index (BMI) 28.0-28.9, adult: Secondary | ICD-10-CM | POA: Diagnosis not present

## 2019-02-09 DIAGNOSIS — E663 Overweight: Secondary | ICD-10-CM | POA: Diagnosis not present

## 2019-02-09 DIAGNOSIS — R945 Abnormal results of liver function studies: Secondary | ICD-10-CM | POA: Diagnosis not present

## 2019-02-09 DIAGNOSIS — D72819 Decreased white blood cell count, unspecified: Secondary | ICD-10-CM | POA: Diagnosis not present

## 2019-02-09 DIAGNOSIS — R1031 Right lower quadrant pain: Secondary | ICD-10-CM | POA: Diagnosis not present

## 2019-02-09 DIAGNOSIS — E7849 Other hyperlipidemia: Secondary | ICD-10-CM | POA: Diagnosis not present

## 2019-02-09 DIAGNOSIS — R7309 Other abnormal glucose: Secondary | ICD-10-CM | POA: Diagnosis not present

## 2019-02-09 DIAGNOSIS — Z789 Other specified health status: Secondary | ICD-10-CM | POA: Diagnosis not present

## 2019-02-09 DIAGNOSIS — J302 Other seasonal allergic rhinitis: Secondary | ICD-10-CM | POA: Diagnosis not present

## 2019-02-09 DIAGNOSIS — R748 Abnormal levels of other serum enzymes: Secondary | ICD-10-CM | POA: Diagnosis not present

## 2019-02-09 DIAGNOSIS — R1903 Right lower quadrant abdominal swelling, mass and lump: Secondary | ICD-10-CM | POA: Diagnosis not present

## 2019-02-17 ENCOUNTER — Encounter: Payer: Self-pay | Admitting: Obstetrics and Gynecology

## 2019-02-17 DIAGNOSIS — R748 Abnormal levels of other serum enzymes: Secondary | ICD-10-CM | POA: Diagnosis not present

## 2019-02-17 DIAGNOSIS — E663 Overweight: Secondary | ICD-10-CM | POA: Diagnosis not present

## 2019-02-17 DIAGNOSIS — R945 Abnormal results of liver function studies: Secondary | ICD-10-CM | POA: Diagnosis not present

## 2019-02-17 DIAGNOSIS — E7849 Other hyperlipidemia: Secondary | ICD-10-CM | POA: Diagnosis not present

## 2019-02-17 DIAGNOSIS — Z1389 Encounter for screening for other disorder: Secondary | ICD-10-CM | POA: Diagnosis not present

## 2019-02-17 DIAGNOSIS — D72819 Decreased white blood cell count, unspecified: Secondary | ICD-10-CM | POA: Diagnosis not present

## 2019-02-17 DIAGNOSIS — Z6828 Body mass index (BMI) 28.0-28.9, adult: Secondary | ICD-10-CM | POA: Diagnosis not present

## 2019-04-05 ENCOUNTER — Encounter (INDEPENDENT_AMBULATORY_CARE_PROVIDER_SITE_OTHER): Payer: Self-pay

## 2019-04-14 ENCOUNTER — Encounter (INDEPENDENT_AMBULATORY_CARE_PROVIDER_SITE_OTHER): Payer: Self-pay | Admitting: Gastroenterology

## 2019-04-14 ENCOUNTER — Other Ambulatory Visit: Payer: Self-pay

## 2019-04-14 ENCOUNTER — Ambulatory Visit (INDEPENDENT_AMBULATORY_CARE_PROVIDER_SITE_OTHER): Payer: 59 | Admitting: Gastroenterology

## 2019-04-14 VITALS — BP 123/81 | HR 92 | Temp 97.3°F | Ht 64.5 in | Wt 169.4 lb

## 2019-04-14 DIAGNOSIS — R748 Abnormal levels of other serum enzymes: Secondary | ICD-10-CM | POA: Diagnosis not present

## 2019-04-14 NOTE — Patient Instructions (Signed)
We are checking labs today for further evaluation and will call w/ results. Please notify us if you have any symptoms of nausea/vomiting w/ meals, pain worse after meals, etc.   Can call when would like to schedule colonoscopy

## 2019-04-14 NOTE — Progress Notes (Signed)
Patient profile: Sierra Browning is a 64 y.o. female seen for evaluation of elevated alk phos. Referred by Dr. Hilma Favors. She was also seen in 2017 for elevated LFTS and these were normal when repeated so no further work up completed.    History of Present Illness: Sierra Browning is seen today for evaluation of abdominal pain, and has been going on approximately 2 years, seems worse in the right mid and right lower quadrant.  It is most prevalent and notable when she is laying on her right side at night.  It is described as a sore aching dull pain that does not worsen with eating. Seems more related to position changes.  Usually has a bowel movement daily or every other day. She denies any blood in stool or black stool.  She reports some very mild chronic nausea but reports a good appetite.  She has GERD symptoms only after trigger foods such as barbecue chicken or sodas.  She denies any dysphagia, epigastric pain. Does not use PPI or chronic GERD therapy.   Regards to liver history she denies any heavy alcohol use.  She does not currently drink alcohol.  She does not use tobacco.  Very occasional NSAIDs.  No known exposures to viral hepatitis, high-risk behaviors, needlesticks, etc.  Wt Readings from Last 3 Encounters:  04/14/19 169 lb 6.4 oz (76.8 kg)  02/21/16 168 lb (76.2 kg)  01/25/16 164 lb 4.8 oz (74.5 kg)       Past Medical History:  Past Medical History:  Diagnosis Date  . Back pain   . Hip pain    rt    Problem List: Patient Active Problem List   Diagnosis Date Noted  . Back pain 01/25/2016  . Hip pain 01/25/2016  . S/P knee surgery 04/20/2013  . Nausea alone 04/20/2013  . CLOSED FRACTURE OF PATELLA 02/06/2009    Past Surgical History: Past Surgical History:  Procedure Laterality Date  . PATELLA FRACTURE SURGERY      Allergies: Allergies  Allergen Reactions  . Gabapentin Itching      Home Medications:  Current Outpatient Medications:  .   Tetrahydrozoline HCl (VISINE OP), Apply to eye. Patient uses 1-2 eye drops as needed for dry eyes both eyes., Disp: , Rfl:  .  VITAMIN D PO, Take 50,000 mg by mouth once a week., Disp: , Rfl:    Family History: family history is not on file.    Social History:   reports that she has quit smoking. She has never used smokeless tobacco. She reports that she does not drink alcohol or use drugs.   Review of Systems: Constitutional: Denies weight loss/weight gain  Eyes: No changes in vision. ENT: No oral lesions, sore throat.  GI: see HPI.  Heme/Lymph: No easy bruising.  CV: No chest pain.  GU: No hematuria.  Integumentary: No rashes.  Neuro: No headaches.  Psych: No depression/anxiety.  Endocrine: No heat/cold intolerance.  Allergic/Immunologic: No urticaria.  Resp: No cough, SOB.  Musculoskeletal: No joint swelling.    Physical Examination: BP 123/81 (BP Location: Right Arm, Patient Position: Sitting, Cuff Size: Large)   Pulse 92   Temp (!) 97.3 F (36.3 C) (Temporal)   Ht 5' 4.5" (1.638 m)   Wt 169 lb 6.4 oz (76.8 kg)   BMI 28.63 kg/m  Gen: NAD, alert and oriented x 4 HEENT: PEERLA, EOMI, Neck: supple, no JVD Chest: CTA bilaterally, no wheezes, crackles, or other adventitious sounds CV: RRR, no m/g/c/r Abd:  soft, NT, ND, +BS in all four quadrants; no HSM, guarding, ridigity, or rebound tenderness Ext: no edema, well perfused with 2+ pulses, Skin: no rash or lesions noted on observed skin Lymph: no noted LAD  Data:  Labs reviewed to be scanned to chart 02/2019--CBC normal, alk phos 151, AST 38, ALT 39, T bili normal.  Sodium 145, glucose 105, otherwise normal.  TSH normal.   Assessment/Plan: [name] is a 64 y.o. female  Sierra Browning was seen today for new patient (initial visit).  Diagnoses and all orders for this visit:  Elevated alkaline phosphatase level -     Alkaline phosphatase, isoenzymes -     Hepatic function panel -     Gamma GT -      Mitochondrial/smooth muscle ab pnl -     Antinuclear Antib (ANA)  Other orders -     Alkaline Phosphatase Isoenzymes -     Anti-smooth muscle antibody, IgG -     Mitochondrial ab rflx titr -     ANA      1.  Elevated alkaline phos-was elevated 2017, normalized and now elevated again.  We will check above work-up for evaluation. She does not drink alcohol.  Brings copy of the CT today completed January 2021at [facility] with fatty liver but otherwise unremarkable, will scan to chart. Further recs pending     2.  Right lower quadrant pain-she does have gallstones on her CT but her pain is clearly right lower and worse with lying on the right side and position changes in bed.  Suspect may have a musculoskeletal component and recommended f/up with PCP. CT unremarkable except anterolisthesis of L4/L5  3. CRC screening - overdue based on age, none prior. Declines scheduleding today, she would like to call if desires.   F/up depending on lab results   I personally performed the service, non-incident to. (WP)  Laurine Blazer, [facility]

## 2019-04-19 LAB — ALKALINE PHOSPHATASE ISOENZYMES
Alkaline phosphatase (APISO): 130 U/L (ref 37–153)
Bone Isoenzymes: 32 % (ref 28–66)
Intestinal Isoenzymes: 8 % (ref 1–24)
Liver Isoenzymes: 60 % (ref 25–69)

## 2019-04-19 LAB — ANA: Anti Nuclear Antibody (ANA): POSITIVE — AB

## 2019-04-19 LAB — ANTI-NUCLEAR AB-TITER (ANA TITER): ANA Titer 1: 1:80 {titer} — ABNORMAL HIGH

## 2019-04-19 LAB — HEPATIC FUNCTION PANEL
AG Ratio: 1.4 (calc) (ref 1.0–2.5)
ALT: 48 U/L — ABNORMAL HIGH (ref 6–29)
AST: 32 U/L (ref 10–35)
Albumin: 4.3 g/dL (ref 3.6–5.1)
Alkaline phosphatase (APISO): 123 U/L (ref 37–153)
Bilirubin, Direct: 0.1 mg/dL (ref 0.0–0.2)
Globulin: 3 g/dL (calc) (ref 1.9–3.7)
Indirect Bilirubin: 0.4 mg/dL (calc) (ref 0.2–1.2)
Total Bilirubin: 0.5 mg/dL (ref 0.2–1.2)
Total Protein: 7.3 g/dL (ref 6.1–8.1)

## 2019-04-19 LAB — GAMMA GT: GGT: 21 U/L (ref 3–65)

## 2019-04-19 LAB — MITOCHONDRIAL AB RFLX TITR: Mitochondrial Antibody Screen: NEGATIVE

## 2019-04-19 LAB — ANTI-SMOOTH MUSCLE ANTIBODY, IGG: Actin (Smooth Muscle) Antibody (IGG): 23 U — ABNORMAL HIGH (ref <20)

## 2019-04-21 ENCOUNTER — Encounter: Payer: Self-pay | Admitting: Obstetrics and Gynecology

## 2019-04-21 ENCOUNTER — Telehealth (INDEPENDENT_AMBULATORY_CARE_PROVIDER_SITE_OTHER): Payer: Self-pay | Admitting: Gastroenterology

## 2019-04-21 ENCOUNTER — Other Ambulatory Visit: Payer: Self-pay

## 2019-04-21 ENCOUNTER — Ambulatory Visit (INDEPENDENT_AMBULATORY_CARE_PROVIDER_SITE_OTHER): Payer: 59 | Admitting: Obstetrics and Gynecology

## 2019-04-21 VITALS — BP 130/78 | HR 119 | Ht 64.5 in | Wt 171.8 lb

## 2019-04-21 DIAGNOSIS — M25559 Pain in unspecified hip: Secondary | ICD-10-CM

## 2019-04-21 NOTE — Progress Notes (Signed)
Patient ID: Sierra Browning, female   DOB: 1955-11-10, 64 y.o.   MRN: [id number]    [facility] Visit  @DATE @            Patient name: Sierra Browning MRN Sierra Browning  Date of birth: [birth year]  CC & HPI:  Sierra Browning is a 64 y.o. female NEW GYN presenting today for right sided pain and uterine fibroids. She has concerns about some pain on the right side lateral to her anterior superior iliac crest that runs down into her hip her abdomen is quite taut.  She has never had any pregnancies or been sexually active.  Her last Pap smear was 15 years ago.  We offered to do the Pap attempted were unsuccessful due to discomfort of the exam even with the smallest speculum in our office ROS:  ROS CT of abbdomen pelvis w contrast on 03/26/2019 at [facility] showed:  1. No acute findings. Specifically, no abnormalities within the right lower quadrant at site of reported pain. Unremarkable appendix.  2. Fatty infiltration of the liver.  3. Nodular opacity measuring 0.5 cm within the left lower lobe, favored to reflect atelectasis.  4. Gallstones without active inflammation.  5. Diverticulosis without focal diverticulitis.  6. Left adnexal lesion measuring 2.3 cm with central calcification. The lesion appears to abut the uterus and is discrete from the left ovary. This likely reflects a uterine fibroid. Ultrasound of the pelvis recommended for confirmation.  7. Additional findings as detailed above.  Electronically Signed by: [facility]    Pertinent History Reviewed:   Reviewed: Significant for  Medical         Past Medical History:  Diagnosis Date  . Back pain   . Hip pain    rt                              Surgical Hx:    Past Surgical History:  Procedure Laterality Date  . PATELLA FRACTURE SURGERY     Medications: Reviewed & Updated - see associated section                       Current Outpatient Medications:  .  fluticasone (FLONASE) 50 MCG/ACT nasal spray,  Place 2 sprays into both nostrils daily., Disp: , Rfl:  .  loratadine (CLARITIN) 10 MG tablet, Take 10 mg by mouth daily., Disp: , Rfl:  .  Tetrahydrozoline HCl (VISINE OP), Apply to eye. Patient uses 1-2 eye drops as needed for dry eyes both eyes., Disp: , Rfl:  .  triamcinolone cream (KENALOG) 0.1 %, APPLY A THIN LAYER TO THELAFFECTED AREA TWICE DAILY., Disp: , Rfl:  .  Vitamin D, Ergocalciferol, (DRISDOL) 1.25 MG (50000 UNIT) CAPS capsule, Take 50,000 Units by mouth once a week., Disp: , Rfl:    Social History: Reviewed -  reports that she has quit smoking. She has never used smokeless tobacco.  Objective Findings:  Vitals: Blood pressure 130/78, pulse (!) 119, height 5' 4.5" (1.638 m), weight 171 lb 12.8 oz (77.9 kg).  PHYSICAL EXAMINATION General appearance - alert, well appearing, and in no distress Mental status - normal mood, behavior, speech, dress, motor activity, and thought processes, affect appropriate to mood Abdomen - Abdomen fullness, strong muscles tone  PELVIC Vagina - Normal, Hymens intact Cervix - normal appearing Uterus - no masses  Unable to tolerate even smallest 5 mm speculum  Assessment & Plan:   A:  1. Left adnexal lesion 2.3 cm consistent with fibroid per CT scan interpretation 2. Right sided pelvic pain doubt GYN origin to right side pain; unspecified 3. Fatty liver per CT scan asymptomatic gallstones  P:  1.  F/u PRN    By signing my name below, I, Samul Dada, attest that this documentation has been prepared under the direction and in the presence of Jonnie Kind, MD. Electronically Signed: [place]. 04/21/19. 3:07 PM.  I personally performed the services described in this documentation, which was SCRIBED in my presence. The recorded information has been reviewed and considered accurate. It has been edited as necessary during review. Jonnie Kind, MD

## 2019-04-21 NOTE — Telephone Encounter (Signed)
Patient called regarding test results - ph# [phone]

## 2019-04-22 NOTE — Telephone Encounter (Signed)
I called patient with results.  See result note test.

## 2019-04-23 ENCOUNTER — Other Ambulatory Visit (INDEPENDENT_AMBULATORY_CARE_PROVIDER_SITE_OTHER): Payer: Self-pay | Admitting: *Deleted

## 2019-04-23 DIAGNOSIS — R748 Abnormal levels of other serum enzymes: Secondary | ICD-10-CM

## 2019-07-06 ENCOUNTER — Other Ambulatory Visit (INDEPENDENT_AMBULATORY_CARE_PROVIDER_SITE_OTHER): Payer: Self-pay | Admitting: *Deleted

## 2019-07-06 DIAGNOSIS — R748 Abnormal levels of other serum enzymes: Secondary | ICD-10-CM

## 2019-07-29 LAB — HEPATIC FUNCTION PANEL
AG Ratio: 1.6 (calc) (ref 1.0–2.5)
ALT: 43 U/L — ABNORMAL HIGH (ref 6–29)
AST: 32 U/L (ref 10–35)
Albumin: 4.1 g/dL (ref 3.6–5.1)
Alkaline phosphatase (APISO): 124 U/L (ref 37–153)
Bilirubin, Direct: 0.1 mg/dL (ref 0.0–0.2)
Globulin: 2.6 g/dL (calc) (ref 1.9–3.7)
Indirect Bilirubin: 0.4 mg/dL (calc) (ref 0.2–1.2)
Total Bilirubin: 0.5 mg/dL (ref 0.2–1.2)
Total Protein: 6.7 g/dL (ref 6.1–8.1)

## 2019-07-29 LAB — ANA: Anti Nuclear Antibody (ANA): POSITIVE — AB

## 2019-07-29 LAB — MITOCHONDRIAL ANTIBODIES: Mitochondrial M2 Ab, IgG: <=20 U

## 2019-07-29 LAB — ANTI-NUCLEAR AB-TITER (ANA TITER)
ANA TITER: 1:40 {titer} — ABNORMAL HIGH
ANA Titer 1: 1:80 {titer} — ABNORMAL HIGH

## 2019-07-29 LAB — ANTI-SMOOTH MUSCLE ANTIBODY, IGG: Actin (Smooth Muscle) Antibody (IGG): 23 U — ABNORMAL HIGH (ref <20)

## 2019-07-29 NOTE — Progress Notes (Signed)
Results reviewed with patient. ALT has decreased from 48 in February this year to 86 now. ANA is positive at a low titer and SMA is positive a low titer. AMM is negative. I suspect that she has fatty liver rather than autoimmune disease. Patient still complains of intermittent pain under the right rib cage. CT 4 months ago suggested gallstones. Proceed with abdominal ultrasound. Is also interested in screening colonoscopy which will be scheduled after ultrasound completed.

## 2019-08-02 ENCOUNTER — Other Ambulatory Visit (INDEPENDENT_AMBULATORY_CARE_PROVIDER_SITE_OTHER): Payer: Self-pay | Admitting: *Deleted

## 2019-08-02 DIAGNOSIS — R109 Unspecified abdominal pain: Secondary | ICD-10-CM

## 2019-08-06 ENCOUNTER — Ambulatory Visit (HOSPITAL_COMMUNITY)
Admission: RE | Admit: 2019-08-06 | Discharge: 2019-08-06 | Disposition: A | Discharge status: Home / Self Care | Payer: 59 | Source: Ambulatory Visit | Attending: Internal Medicine | Admitting: Internal Medicine

## 2019-08-06 ENCOUNTER — Other Ambulatory Visit: Payer: Self-pay

## 2019-08-06 DIAGNOSIS — R109 Unspecified abdominal pain: Secondary | ICD-10-CM | POA: Diagnosis not present

## 2019-08-12 ENCOUNTER — Other Ambulatory Visit (INDEPENDENT_AMBULATORY_CARE_PROVIDER_SITE_OTHER): Payer: Self-pay | Admitting: *Deleted

## 2019-08-12 ENCOUNTER — Encounter (INDEPENDENT_AMBULATORY_CARE_PROVIDER_SITE_OTHER): Payer: Self-pay | Admitting: *Deleted

## 2019-08-12 ENCOUNTER — Telehealth (INDEPENDENT_AMBULATORY_CARE_PROVIDER_SITE_OTHER): Payer: Self-pay | Admitting: *Deleted

## 2019-08-12 DIAGNOSIS — Z1211 Encounter for screening for malignant neoplasm of colon: Secondary | ICD-10-CM

## 2019-08-12 NOTE — Telephone Encounter (Signed)
Patient needs suprep 

## 2019-08-13 MED ORDER — SUPREP BOWEL PREP KIT 17.5-3.13-1.6 GM/177ML PO SOLN: 1.0000 | Freq: Once | ORAL | 0 refills | Status: AC

## 2019-09-07 ENCOUNTER — Other Ambulatory Visit: Payer: Self-pay

## 2019-09-07 ENCOUNTER — Other Ambulatory Visit (HOSPITAL_COMMUNITY)
Admission: RE | Admit: 2019-09-07 | Discharge: 2019-09-07 | Disposition: A | Discharge status: Home / Self Care | Payer: 59 | Source: Ambulatory Visit | Attending: Internal Medicine | Admitting: Internal Medicine

## 2019-09-07 DIAGNOSIS — Z01812 Encounter for preprocedural laboratory examination: Secondary | ICD-10-CM | POA: Insufficient documentation

## 2019-09-07 DIAGNOSIS — Z20822 Contact with and (suspected) exposure to covid-19: Secondary | ICD-10-CM | POA: Diagnosis not present

## 2019-09-08 LAB — SARS CORONAVIRUS 2 (TAT 6-24 HRS): SARS Coronavirus 2: NEGATIVE

## 2019-09-09 ENCOUNTER — Ambulatory Visit (HOSPITAL_COMMUNITY)
Admission: RE | Admit: 2019-09-09 | Discharge: 2019-09-09 | Disposition: A | Discharge status: Home / Self Care | Payer: 59 | Attending: Internal Medicine | Admitting: Internal Medicine

## 2019-09-09 ENCOUNTER — Encounter (HOSPITAL_COMMUNITY)
Admission: RE | Disposition: A | Discharge status: Home / Self Care | Payer: Self-pay | Source: Home / Self Care | Attending: Internal Medicine

## 2019-09-09 ENCOUNTER — Encounter (HOSPITAL_COMMUNITY): Payer: Self-pay | Admitting: Internal Medicine

## 2019-09-09 ENCOUNTER — Other Ambulatory Visit: Payer: Self-pay

## 2019-09-09 DIAGNOSIS — Z823 Family history of stroke: Secondary | ICD-10-CM | POA: Diagnosis not present

## 2019-09-09 DIAGNOSIS — K5730 Diverticulosis of large intestine without perforation or abscess without bleeding: Secondary | ICD-10-CM

## 2019-09-09 DIAGNOSIS — K760 Fatty (change of) liver, not elsewhere classified: Secondary | ICD-10-CM | POA: Insufficient documentation

## 2019-09-09 DIAGNOSIS — Z1211 Encounter for screening for malignant neoplasm of colon: Secondary | ICD-10-CM

## 2019-09-09 DIAGNOSIS — K644 Residual hemorrhoidal skin tags: Secondary | ICD-10-CM | POA: Insufficient documentation

## 2019-09-09 DIAGNOSIS — Z87891 Personal history of nicotine dependence: Secondary | ICD-10-CM | POA: Insufficient documentation

## 2019-09-09 DIAGNOSIS — Z8249 Family history of ischemic heart disease and other diseases of the circulatory system: Secondary | ICD-10-CM | POA: Diagnosis not present

## 2019-09-09 DIAGNOSIS — Z888 Allergy status to other drugs, medicaments and biological substances status: Secondary | ICD-10-CM | POA: Diagnosis not present

## 2019-09-09 HISTORY — PX: COLONOSCOPY: SHX5424

## 2019-09-09 SURGERY — COLONOSCOPY
Anesthesia: Moderate Sedation

## 2019-09-09 MED ORDER — MIDAZOLAM HCL 5 MG/5ML IJ SOLN
INTRAMUSCULAR | Status: DC | PRN
  Administered 2019-09-09: 2 mg via INTRAVENOUS
  Administered 2019-09-09: 1 mg via INTRAVENOUS
  Administered 2019-09-09 (×2): 2 mg via INTRAVENOUS

## 2019-09-09 MED ORDER — MEPERIDINE HCL 50 MG/ML IJ SOLN
INTRAMUSCULAR | Status: DC | PRN
  Administered 2019-09-09 (×2): 25 mg

## 2019-09-09 MED ORDER — MEPERIDINE HCL 50 MG/ML IJ SOLN
INTRAMUSCULAR | Status: AC
  Filled 2019-09-09: qty 1

## 2019-09-09 MED ORDER — MIDAZOLAM HCL 5 MG/5ML IJ SOLN
INTRAMUSCULAR | Status: AC
  Filled 2019-09-09: qty 10

## 2019-09-09 MED ORDER — SODIUM CHLORIDE 0.9 % IV SOLN: INTRAVENOUS | Status: DC

## 2019-09-09 NOTE — H&P (Signed)
Sierra Browning is an 64 y.o. female.   Chief Complaint: Patient is here for colonoscopy. HPI: Patient is 64 year old Caucasian female who is here for screening colonoscopy.  She denies abdominal pain change in bowel habits or rectal bleeding.  This is patient's first colonoscopy.  She has a history of intermittent right flank pain which is felt to be musculoskeletal. Patient does not take any prescription medications. Family history is negative for CRC.  Past Medical History:  Diagnosis Date  . Back pain   . Hip pain    rt       Fatty liver.  Past Surgical History:  Procedure Laterality Date  . CYST REMOVAL NECK     removed when a baby  . PATELLA FRACTURE SURGERY      Family History  Problem Relation Age of Onset  . Stroke Father   . Hypertension Mother   . Hypertension Brother    Social History:  reports that she has quit smoking. She has never used smokeless tobacco. She reports that she does not drink alcohol and does not use drugs.  Allergies:  Allergies  Allergen Reactions  . Gabapentin Itching    Medications Prior to Admission  Medication Sig Dispense Refill  . cholecalciferol (VITAMIN D3) 25 MCG (1000 UNIT) tablet Take 1,000 Units by mouth daily.      Results for orders placed or performed during the hospital encounter of 09/07/19 (from the past 48 hour(s))  SARS CORONAVIRUS 2 (TAT 6-24 HRS) Nasopharyngeal Nasopharyngeal Swab     Status: None   Collection Time: 09/07/19 10:30 AM   Specimen: Nasopharyngeal Swab  Result Value Ref Range   SARS Coronavirus 2 NEGATIVE NEGATIVE    Comment: (NOTE) SARS-CoV-2 target nucleic acids are NOT DETECTED.  The SARS-CoV-2 RNA is generally detectable in upper and lower respiratory specimens during the acute phase of infection. Negative results do not preclude SARS-CoV-2 infection, do not rule out co-infections with other pathogens, and should not be used as the sole basis for treatment or other patient management  decisions. Negative results must be combined with clinical observations, patient history, and epidemiological information. The expected result is Negative.  Fact Sheet for Patients: HairSlick.no  Fact Sheet for Healthcare Providers: quierodirigir.com  This test is not yet approved or cleared by the [place] FDA and  has been authorized for detection and/or diagnosis of SARS-CoV-2 by FDA under an Emergency Use Authorization (EUA). This EUA will remain  in effect (meaning this test can be used) for the duration of the COVID-19 declaration under Se ction 564(b)(1) of the Act, 21 U.S.C. section 360bbb-3(b)(1), unless the authorization is terminated or revoked sooner.  Performed at [facility], [address] [place], [place] [address]    No results found.  Review of Systems  Blood pressure 126/80, pulse 81, temperature 98.6 F (37 C), temperature source Oral, resp. rate 17, height 5' 4.5" (1.638 m), SpO2 100 %. Physical Exam HENT:     Mouth/Throat:     Mouth: Mucous membranes are moist.     Pharynx: Oropharynx is clear.  Eyes:     General: No scleral icterus.    Conjunctiva/sclera: Conjunctivae normal.  Cardiovascular:     Rate and Rhythm: Normal rate and regular rhythm.     Heart sounds: Normal heart sounds. No murmur heard.   Pulmonary:     Effort: Pulmonary effort is normal.     Breath sounds: Normal breath sounds.  Abdominal:     Comments: Abdomen  is full.  Soft and nontender with organomegaly or masses.  Musculoskeletal:        General: No swelling.     Cervical back: Neck supple.  Lymphadenopathy:     Cervical: No cervical adenopathy.  Skin:    General: Skin is warm and dry.  Neurological:     Mental Status: She is alert.  Psychiatric:        Mood and Affect: Mood normal.      Assessment/Plan Average risk screening colonoscopy.  Lionel December, MD 09/09/2019, 7:37 AM

## 2019-09-09 NOTE — Op Note (Signed)
[facility] Patient Name: Sierra Browning Procedure Date: 09/09/2019 7:00 AM MRN: [id number] Date of Birth: 25-Nov-1955 Attending MD: Lionel December , MD CSN: [id number] Age: 64 Admit Type: Outpatient Procedure:                Colonoscopy Indications:              Screening for colorectal malignant neoplasm Providers:                Lionel December, MD, Crystal Page, Dyann Ruddle Referring MD:             Corrie Mckusick, MD Medicines:                Meperidine 50 mg IV, Midazolam 7 mg IV Complications:            No immediate complications. Estimated Blood Loss:     Estimated blood loss: none. Procedure:                Pre-Anesthesia Assessment:                           - Prior to the procedure, a History and Physical                            was performed, and patient medications and                            allergies were reviewed. The patient's tolerance of                            previous anesthesia was also reviewed. The risks                            and benefits of the procedure and the sedation                            options and risks were discussed with the patient.                            All questions were answered, and informed consent                            was obtained. Prior Anticoagulants: The patient has                            taken no previous anticoagulant or antiplatelet                            agents. ASA Grade Assessment: I - A normal, healthy                            patient. After reviewing the risks and benefits,                            the patient was deemed in satisfactory condition to  undergo the procedure.                           After obtaining informed consent, the colonoscope                            was passed under direct vision. Throughout the                            procedure, the patient's blood pressure, pulse, and                            oxygen saturations were monitored  continuously. The                            [id number]-[id number] ([id number] scope was introduced through                            the anus and advanced to the the cecum, identified                            by appendiceal orifice and ileocecal valve. The                            colonoscopy was performed without difficulty. The                            patient tolerated the procedure well. The quality                            of the bowel preparation was excellent. The                            ileocecal valve, appendiceal orifice, and rectum                            were photographed. Scope In: 7:48:08 AM Scope Out: 8:06:43 AM Scope Withdrawal Time: 0 hours 10 minutes 26 seconds  Total Procedure Duration: 0 hours 18 minutes 35 seconds  Findings:      The perianal and digital rectal examinations were normal.      A few diverticula were found in the sigmoid colon and ascending colon.      The exam was otherwise normal throughout the examined colon.      External hemorrhoids were found during retroflexion. The hemorrhoids       were small. Impression:               - Diverticulosis in the sigmoid colon and in the                            ascending colon.                           - External hemorrhoids.                           -  No specimens collected. Moderate Sedation:      Moderate (conscious) sedation was administered by the endoscopy nurse       and supervised by the endoscopist. The following parameters were       monitored: oxygen saturation, heart rate, blood pressure, CO2       capnography and response to care. Total physician intraservice time was       25 minutes. Recommendation:           - Patient has a contact number available for                            emergencies. The signs and symptoms of potential                            delayed complications were discussed with the                            patient. Return to normal activities tomorrow.                             Written discharge instructions were provided to the                            patient.                           - High fiber diet today.                           - Continue present medications.                           - Repeat colonoscopy in 10 years for screening                            purposes. Procedure Code(s):        --- Professional ---                           [phone], Colonoscopy, flexible; diagnostic, including                            collection of specimen(s) by brushing or washing,                            when performed (separate procedure)                           99153, Moderate sedation; each additional 15                            minutes intraservice time                           G0500, Moderate sedation services provided by the  same physician or other qualified health care                            professional performing a gastrointestinal                            endoscopic service that sedation supports,                            requiring the presence of an independent trained                            observer to assist in the monitoring of the                            patient's level of consciousness and physiological                            status; initial 15 minutes of intra-service time;                            patient age 11 years or older (additional time may                            be reported with 19147, as appropriate) Diagnosis Code(s):        --- Professional ---                           Z12.11, Encounter for screening for malignant                            neoplasm of colon                           K64.4, Residual hemorrhoidal skin tags                           K57.30, Diverticulosis of large intestine without                            perforation or abscess without bleeding CPT copyright 2019 American Medical Association. All rights reserved. The codes documented in this report  are preliminary and upon coder review may  be revised to meet current compliance requirements. Lionel December, MD Lionel December, MD 09/09/2019 8:16:34 AM This report has been signed electronically. Number of Addenda: 0

## 2019-09-09 NOTE — Discharge Instructions (Signed)
Colonoscopy, Adult, Care After This sheet gives you information about how to care for yourself after your procedure. Your health care provider may also give you more specific instructions. If you have problems or questions, contact your health care provider. What can I expect after the procedure? After the procedure, it is common to have:  A small amount of blood in your stool for 24 hours after the procedure.  Some gas.  Mild cramping or bloating of your abdomen. Follow these instructions at home: Eating and drinking   Drink enough fluid to keep your urine pale yellow.  Follow instructions from your health care provider about eating or drinking restrictions.  Resume your normal diet as instructed by your health care provider. Avoid heavy or fried foods that are hard to digest. Activity  Rest as told by your health care provider.  Avoid sitting for a long time without moving. Get up to take short walks every 1-2 hours. This is important to improve blood flow and breathing. Ask for help if you feel weak or unsteady.  Return to your normal activities as told by your health care provider. Ask your health care provider what activities are safe for you. Managing cramping and bloating   Try walking around when you have cramps or feel bloated.  Apply heat to your abdomen as told by your health care provider. Use the heat source that your health care provider recommends, such as a moist heat pack or a heating pad. ? Place a towel between your skin and the heat source. ? Leave the heat on for 20-30 minutes. ? Remove the heat if your skin turns bright red. This is especially important if you are unable to feel pain, heat, or cold. You may have a greater risk of getting burned. General instructions  For the first 24 hours after the procedure: ? Do not drive or use machinery. ? Do not sign important documents. ? Do not drink alcohol. ? Do your regular daily activities at a slower pace  than normal. ? Eat soft foods that are easy to digest.  Take over-the-counter and prescription medicines only as told by your health care provider.  Keep all follow-up visits as told by your health care provider. This is important. Contact a health care provider if:  You have blood in your stool 2-3 days after the procedure. Get help right away if you have:  More than a small spotting of blood in your stool.  Large blood clots in your stool.  Swelling of your abdomen.  Nausea or vomiting.  A fever.  Increasing pain in your abdomen that is not relieved with medicine. Summary  After the procedure, it is common to have a small amount of blood in your stool. You may also have mild cramping and bloating of your abdomen.  For the first 24 hours after the procedure, do not drive or use machinery, sign important documents, or drink alcohol.  Get help right away if you have a lot of blood in your stool, nausea or vomiting, a fever, or increased pain in your abdomen. This information is not intended to replace advice given to you by your health care provider. Make sure you discuss any questions you have with your health care provider. Document Revised: 09/21/2018 Document Reviewed: 09/21/2018 Elsevier Patient Education  2020 [employer]. Resume usual medications as before High-fiber diet No driving for 24 hours Next screening exam in 10 years.

## 2019-09-13 ENCOUNTER — Other Ambulatory Visit (HOSPITAL_COMMUNITY): Payer: Self-pay | Admitting: Family Medicine

## 2019-09-13 DIAGNOSIS — Z1231 Encounter for screening mammogram for malignant neoplasm of breast: Secondary | ICD-10-CM

## 2019-09-17 ENCOUNTER — Encounter (HOSPITAL_COMMUNITY): Payer: Self-pay | Admitting: Internal Medicine

## 2019-09-22 ENCOUNTER — Other Ambulatory Visit: Payer: Self-pay

## 2019-09-22 ENCOUNTER — Ambulatory Visit (HOSPITAL_COMMUNITY)
Admission: RE | Admit: 2019-09-22 | Discharge: 2019-09-22 | Disposition: A | Discharge status: Home / Self Care | Payer: 59 | Source: Ambulatory Visit | Attending: Family Medicine | Admitting: Family Medicine

## 2019-09-22 DIAGNOSIS — Z1231 Encounter for screening mammogram for malignant neoplasm of breast: Secondary | ICD-10-CM

## 2020-08-21 ENCOUNTER — Other Ambulatory Visit (HOSPITAL_COMMUNITY): Payer: Self-pay | Admitting: Family Medicine

## 2020-08-21 DIAGNOSIS — Z1231 Encounter for screening mammogram for malignant neoplasm of breast: Secondary | ICD-10-CM

## 2020-09-25 ENCOUNTER — Ambulatory Visit (HOSPITAL_COMMUNITY): Payer: 59

## 2020-09-28 ENCOUNTER — Ambulatory Visit (HOSPITAL_COMMUNITY)
Admission: RE | Admit: 2020-09-28 | Discharge: 2020-09-28 | Disposition: A | Discharge status: Home / Self Care | Payer: 59 | Source: Ambulatory Visit | Attending: Family Medicine | Admitting: Family Medicine

## 2020-09-28 ENCOUNTER — Other Ambulatory Visit: Payer: Self-pay

## 2020-09-28 DIAGNOSIS — Z1231 Encounter for screening mammogram for malignant neoplasm of breast: Secondary | ICD-10-CM | POA: Diagnosis not present

## 2021-07-18 ENCOUNTER — Other Ambulatory Visit (HOSPITAL_COMMUNITY): Payer: Self-pay | Admitting: Family Medicine

## 2021-07-18 DIAGNOSIS — Z1231 Encounter for screening mammogram for malignant neoplasm of breast: Secondary | ICD-10-CM

## 2021-08-15 IMAGING — MG MM DIGITAL SCREENING BILAT W/ TOMO AND CAD
8 series · 9 of 24 positions shown · non-contrast
Comparison: Previous exam(s).

CLINICAL DATA: Screening.

EXAM:
DIGITAL SCREENING BILATERAL MAMMOGRAM WITH TOMOSYNTHESIS AND CAD
TECHNIQUE: Bilateral screening digital craniocaudal and mediolateral oblique
mammograms were obtained. Bilateral screening digital breast
tomosynthesis was performed. The images were evaluated with
computer-aided detection.

[R CC synth-2D]
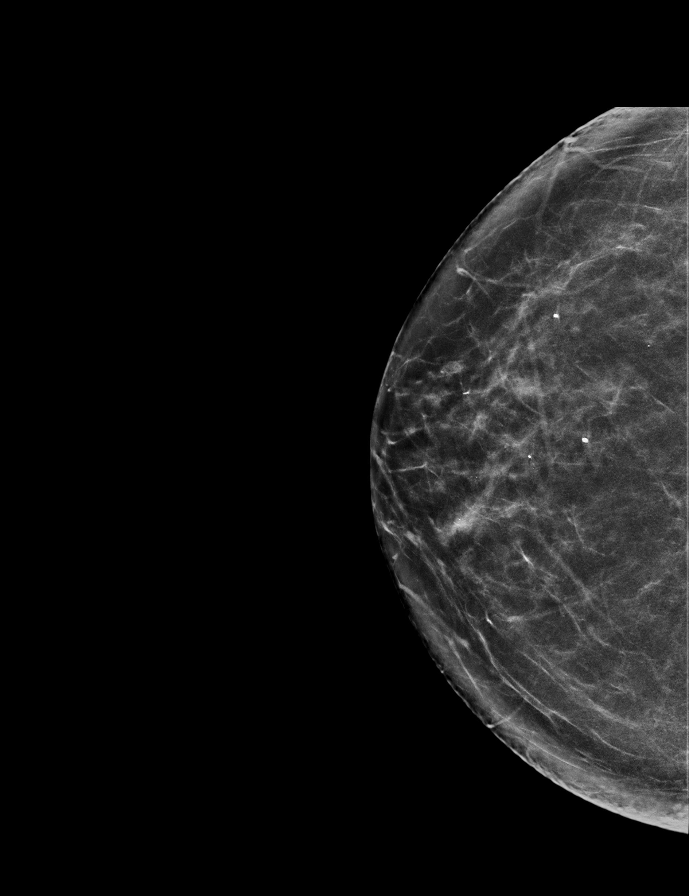

[L MLO synth-2D]
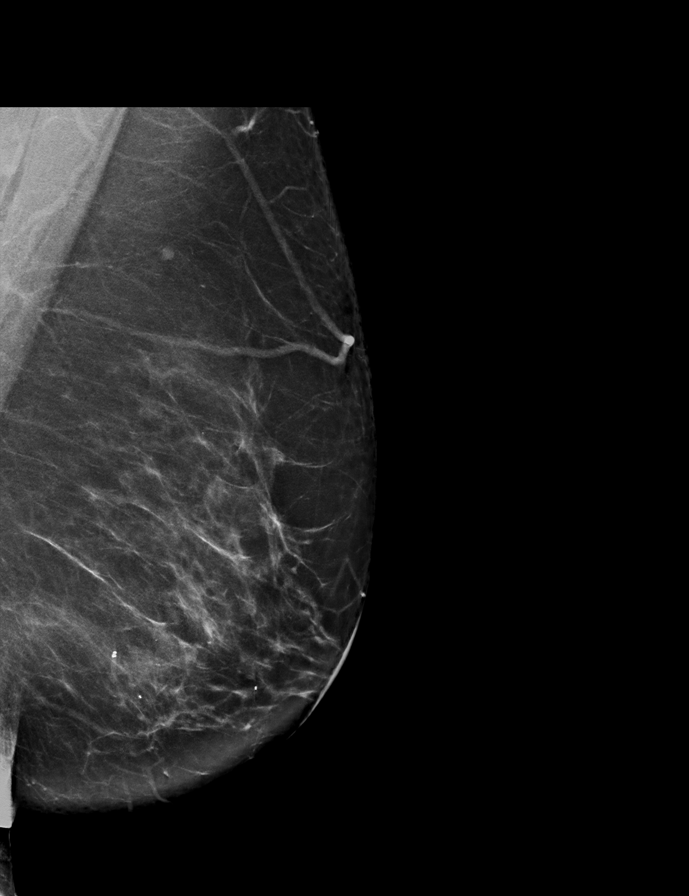

[R MLO synth-2D]
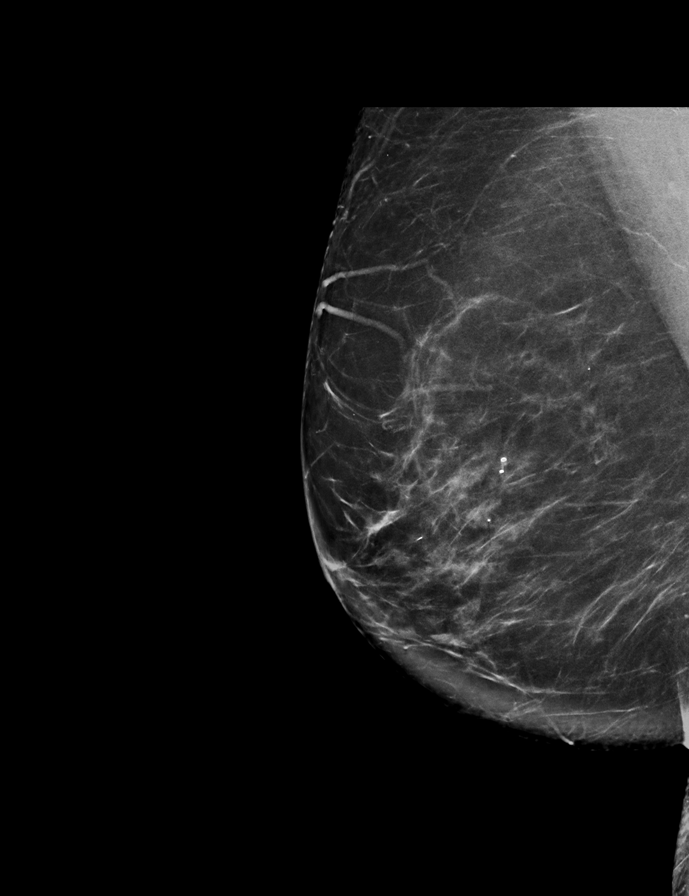

[L CC synth-2D]
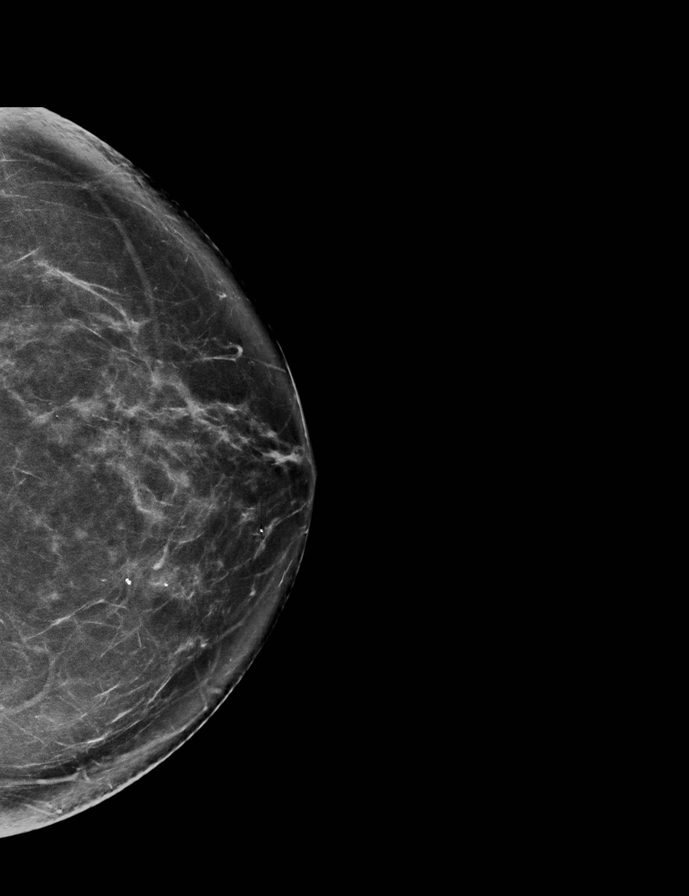

[L MLO tomo · 2 of 88 frames shown]
[frame 29/88]
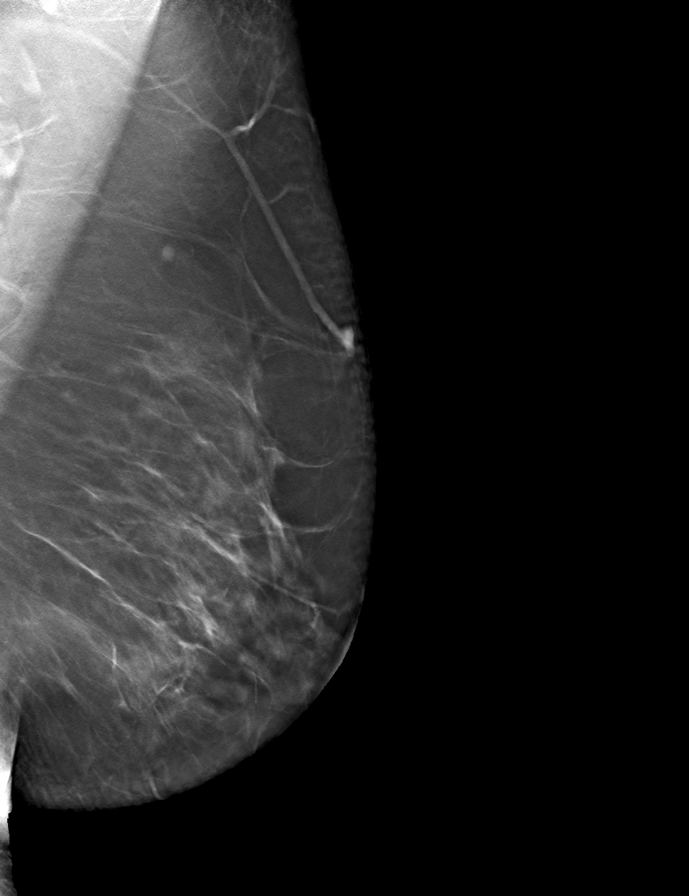
[frame 45/88]
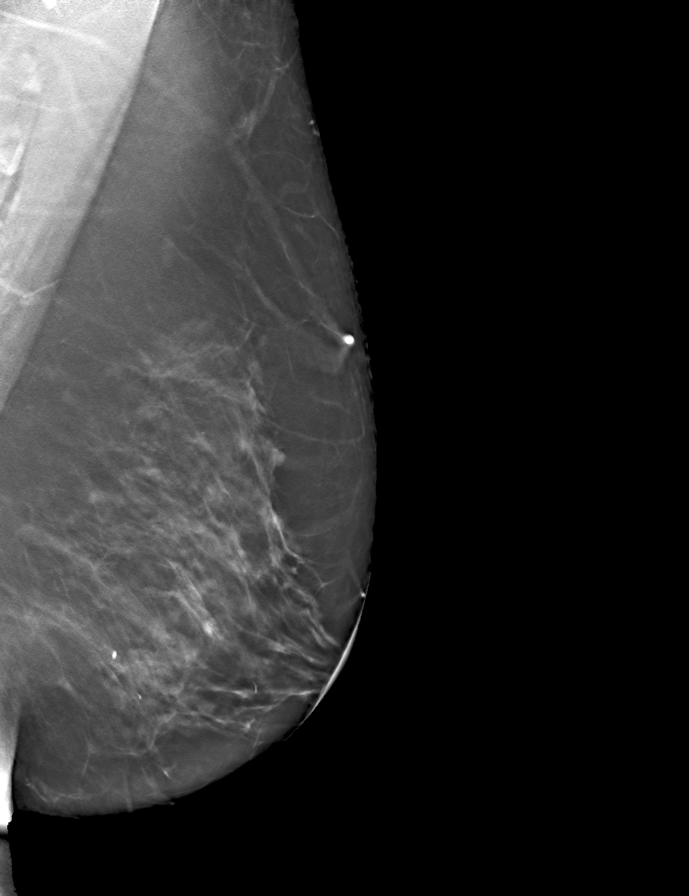

[R CC tomo · tomo slice 37/72.0]
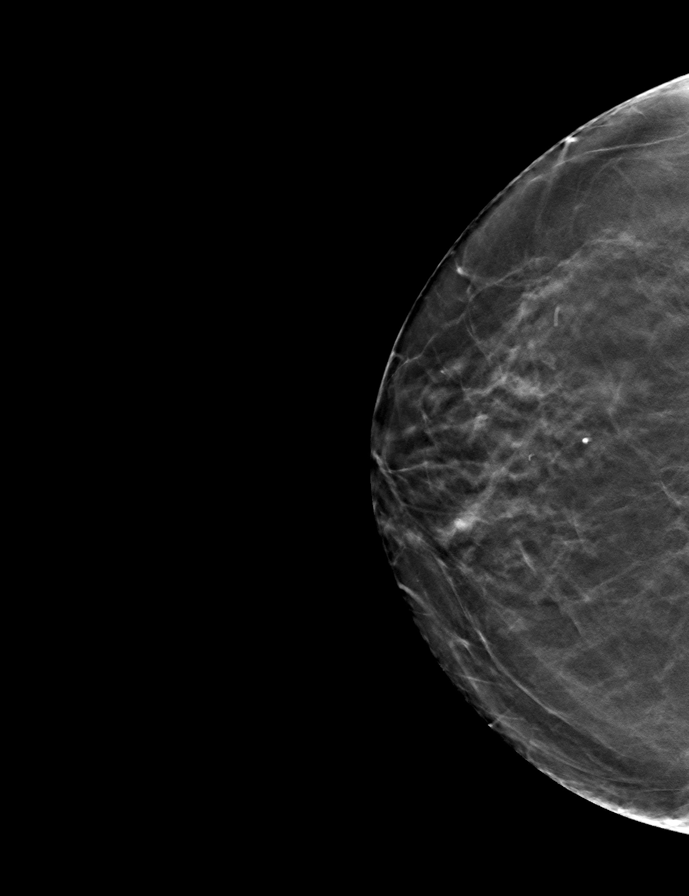

[L CC tomo · tomo slice 40/79.0]
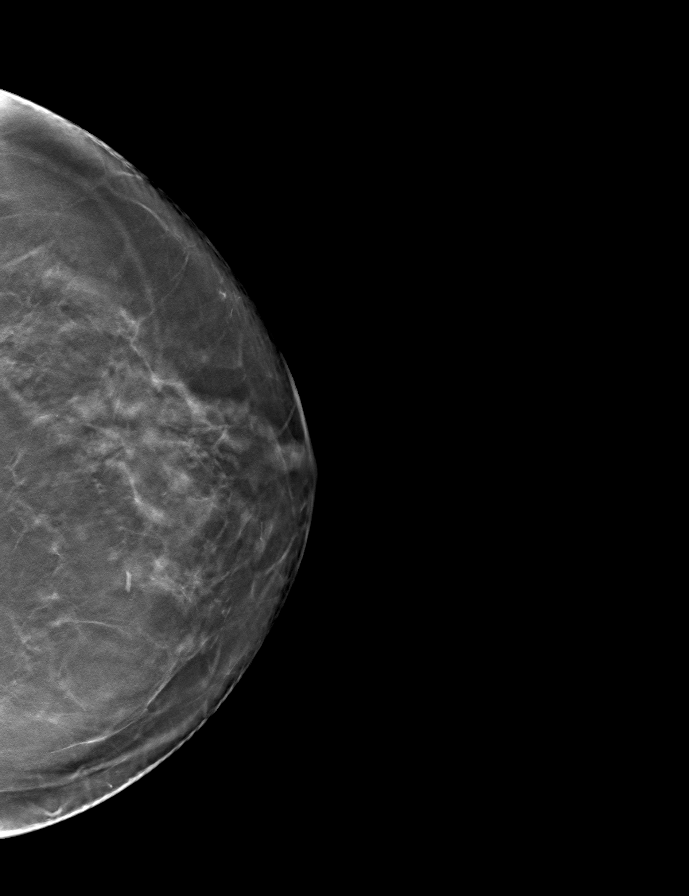

[R MLO tomo · tomo slice 42/83.0]
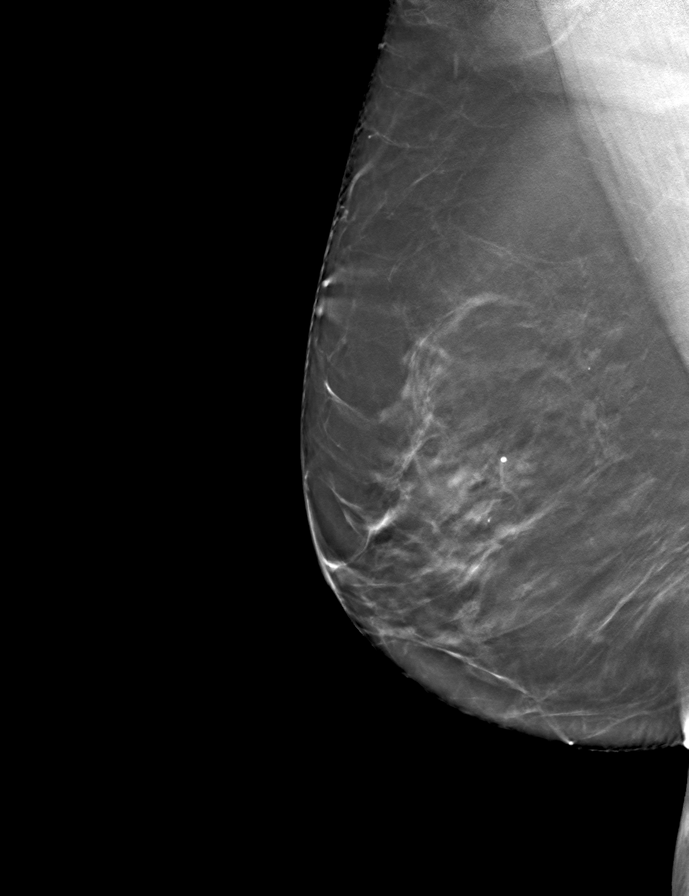

[9 of 24 positions shown; findings below may reference images not displayed]

ACR Breast Density Category b: There are scattered areas of
fibroglandular density.
FINDINGS: There are no findings suspicious for malignancy.
IMPRESSION: No mammographic evidence of malignancy. A result letter of this
screening mammogram will be mailed directly to the patient.

RECOMMENDATION:
Screening mammogram in one year. (Code:51-O-LD2)

BI-RADS CATEGORY  1: Negative.

## 2021-10-01 ENCOUNTER — Ambulatory Visit (HOSPITAL_COMMUNITY)
Admission: RE | Admit: 2021-10-01 | Discharge: 2021-10-01 | Disposition: A | Discharge status: Home / Self Care | Payer: 59 | Source: Ambulatory Visit | Attending: Family Medicine | Admitting: Family Medicine

## 2021-10-01 DIAGNOSIS — Z1231 Encounter for screening mammogram for malignant neoplasm of breast: Secondary | ICD-10-CM | POA: Diagnosis present

## 2022-01-07 DIAGNOSIS — Z6827 Body mass index (BMI) 27.0-27.9, adult: Secondary | ICD-10-CM | POA: Diagnosis not present

## 2022-01-07 DIAGNOSIS — Z23 Encounter for immunization: Secondary | ICD-10-CM | POA: Diagnosis not present

## 2022-01-07 DIAGNOSIS — E782 Mixed hyperlipidemia: Secondary | ICD-10-CM | POA: Diagnosis not present

## 2022-01-07 DIAGNOSIS — R7309 Other abnormal glucose: Secondary | ICD-10-CM | POA: Diagnosis not present

## 2022-01-07 DIAGNOSIS — D72819 Decreased white blood cell count, unspecified: Secondary | ICD-10-CM | POA: Diagnosis not present

## 2022-01-07 DIAGNOSIS — K760 Fatty (change of) liver, not elsewhere classified: Secondary | ICD-10-CM | POA: Diagnosis not present

## 2022-01-07 DIAGNOSIS — E119 Type 2 diabetes mellitus without complications: Secondary | ICD-10-CM | POA: Diagnosis not present

## 2022-01-07 DIAGNOSIS — E7849 Other hyperlipidemia: Secondary | ICD-10-CM | POA: Diagnosis not present

## 2022-06-28 ENCOUNTER — Other Ambulatory Visit (HOSPITAL_COMMUNITY): Payer: Self-pay | Admitting: Family Medicine

## 2022-06-28 DIAGNOSIS — R7309 Other abnormal glucose: Secondary | ICD-10-CM | POA: Diagnosis not present

## 2022-06-28 DIAGNOSIS — R945 Abnormal results of liver function studies: Secondary | ICD-10-CM | POA: Diagnosis not present

## 2022-06-28 DIAGNOSIS — Z1231 Encounter for screening mammogram for malignant neoplasm of breast: Secondary | ICD-10-CM

## 2022-06-28 DIAGNOSIS — E7849 Other hyperlipidemia: Secondary | ICD-10-CM | POA: Diagnosis not present

## 2022-06-28 DIAGNOSIS — Z1331 Encounter for screening for depression: Secondary | ICD-10-CM | POA: Diagnosis not present

## 2022-06-28 DIAGNOSIS — T466X5D Adverse effect of antihyperlipidemic and antiarteriosclerotic drugs, subsequent encounter: Secondary | ICD-10-CM | POA: Diagnosis not present

## 2022-06-28 DIAGNOSIS — Z01419 Encounter for gynecological examination (general) (routine) without abnormal findings: Secondary | ICD-10-CM | POA: Diagnosis not present

## 2022-06-28 DIAGNOSIS — K760 Fatty (change of) liver, not elsewhere classified: Secondary | ICD-10-CM | POA: Diagnosis not present

## 2022-06-28 DIAGNOSIS — Z0001 Encounter for general adult medical examination with abnormal findings: Secondary | ICD-10-CM | POA: Diagnosis not present

## 2022-06-28 DIAGNOSIS — E663 Overweight: Secondary | ICD-10-CM | POA: Diagnosis not present

## 2022-06-28 DIAGNOSIS — E782 Mixed hyperlipidemia: Secondary | ICD-10-CM | POA: Diagnosis not present

## 2022-10-07 ENCOUNTER — Ambulatory Visit (HOSPITAL_COMMUNITY)
Admission: RE | Admit: 2022-10-07 | Discharge: 2022-10-07 | Disposition: A | Discharge status: Home / Self Care | Payer: HMO | Source: Ambulatory Visit | Attending: Family Medicine | Admitting: Family Medicine

## 2022-10-07 DIAGNOSIS — Z1231 Encounter for screening mammogram for malignant neoplasm of breast: Secondary | ICD-10-CM | POA: Insufficient documentation

## 2022-10-23 DIAGNOSIS — H814 Vertigo of central origin: Secondary | ICD-10-CM | POA: Diagnosis not present

## 2022-10-23 DIAGNOSIS — Z6828 Body mass index (BMI) 28.0-28.9, adult: Secondary | ICD-10-CM | POA: Diagnosis not present

## 2022-10-23 DIAGNOSIS — E663 Overweight: Secondary | ICD-10-CM | POA: Diagnosis not present

## 2022-10-23 DIAGNOSIS — E119 Type 2 diabetes mellitus without complications: Secondary | ICD-10-CM | POA: Diagnosis not present

## 2022-10-23 DIAGNOSIS — H8110 Benign paroxysmal vertigo, unspecified ear: Secondary | ICD-10-CM | POA: Diagnosis not present

## 2022-12-30 DIAGNOSIS — T466X5D Adverse effect of antihyperlipidemic and antiarteriosclerotic drugs, subsequent encounter: Secondary | ICD-10-CM | POA: Diagnosis not present

## 2022-12-30 DIAGNOSIS — Z1331 Encounter for screening for depression: Secondary | ICD-10-CM | POA: Diagnosis not present

## 2022-12-30 DIAGNOSIS — E782 Mixed hyperlipidemia: Secondary | ICD-10-CM | POA: Diagnosis not present

## 2022-12-30 DIAGNOSIS — K760 Fatty (change of) liver, not elsewhere classified: Secondary | ICD-10-CM | POA: Diagnosis not present

## 2022-12-30 DIAGNOSIS — R945 Abnormal results of liver function studies: Secondary | ICD-10-CM | POA: Diagnosis not present

## 2022-12-30 DIAGNOSIS — Z789 Other specified health status: Secondary | ICD-10-CM | POA: Diagnosis not present

## 2022-12-30 DIAGNOSIS — E7849 Other hyperlipidemia: Secondary | ICD-10-CM | POA: Diagnosis not present

## 2022-12-30 DIAGNOSIS — E663 Overweight: Secondary | ICD-10-CM | POA: Diagnosis not present

## 2022-12-30 DIAGNOSIS — Z6827 Body mass index (BMI) 27.0-27.9, adult: Secondary | ICD-10-CM | POA: Diagnosis not present

## 2022-12-30 DIAGNOSIS — Z23 Encounter for immunization: Secondary | ICD-10-CM | POA: Diagnosis not present

## 2023-05-16 ENCOUNTER — Other Ambulatory Visit (HOSPITAL_COMMUNITY): Payer: Self-pay | Admitting: Family Medicine

## 2023-05-16 DIAGNOSIS — Z1231 Encounter for screening mammogram for malignant neoplasm of breast: Secondary | ICD-10-CM

## 2023-07-04 DIAGNOSIS — E663 Overweight: Secondary | ICD-10-CM | POA: Diagnosis not present

## 2023-07-04 DIAGNOSIS — R945 Abnormal results of liver function studies: Secondary | ICD-10-CM | POA: Diagnosis not present

## 2023-07-04 DIAGNOSIS — R7309 Other abnormal glucose: Secondary | ICD-10-CM | POA: Diagnosis not present

## 2023-07-04 DIAGNOSIS — E782 Mixed hyperlipidemia: Secondary | ICD-10-CM | POA: Diagnosis not present

## 2023-07-04 DIAGNOSIS — Z6828 Body mass index (BMI) 28.0-28.9, adult: Secondary | ICD-10-CM | POA: Diagnosis not present

## 2023-07-04 DIAGNOSIS — Z1331 Encounter for screening for depression: Secondary | ICD-10-CM | POA: Diagnosis not present

## 2023-07-04 DIAGNOSIS — K760 Fatty (change of) liver, not elsewhere classified: Secondary | ICD-10-CM | POA: Diagnosis not present

## 2023-07-04 DIAGNOSIS — E7849 Other hyperlipidemia: Secondary | ICD-10-CM | POA: Diagnosis not present

## 2023-07-04 DIAGNOSIS — Z0001 Encounter for general adult medical examination with abnormal findings: Secondary | ICD-10-CM | POA: Diagnosis not present

## 2023-08-14 ENCOUNTER — Telehealth: Payer: Self-pay

## 2023-08-14 NOTE — Telephone Encounter (Signed)
 Copied from CRM [id number]. Topic: General - Other >> Aug 14, 2023  9:14 AM Hobson Luna F wrote: Reason for CRM: Dominick is calling in because she wants to know when Dr. Lydia Sams is going to be taking patients again. Please advise.

## 2023-10-09 ENCOUNTER — Ambulatory Visit (HOSPITAL_COMMUNITY)

## 2023-10-09 ENCOUNTER — Encounter (HOSPITAL_COMMUNITY): Payer: Self-pay

## 2023-10-09 ENCOUNTER — Ambulatory Visit (HOSPITAL_COMMUNITY)
Admission: RE | Admit: 2023-10-09 | Discharge: 2023-10-09 | Disposition: A | Discharge status: Home / Self Care | Source: Ambulatory Visit | Attending: Family Medicine | Admitting: Family Medicine

## 2023-10-09 DIAGNOSIS — Z1231 Encounter for screening mammogram for malignant neoplasm of breast: Secondary | ICD-10-CM | POA: Diagnosis not present

## 2024-01-09 ENCOUNTER — Ambulatory Visit: Payer: Self-pay

## 2024-01-09 VITALS — BP 120/75 | HR 85 | Resp 16 | Ht 64.5 in | Wt 166.0 lb

## 2024-01-09 DIAGNOSIS — E559 Vitamin D deficiency, unspecified: Secondary | ICD-10-CM | POA: Diagnosis not present

## 2024-01-09 DIAGNOSIS — Z1159 Encounter for screening for other viral diseases: Secondary | ICD-10-CM | POA: Diagnosis not present

## 2024-01-09 DIAGNOSIS — Z23 Encounter for immunization: Secondary | ICD-10-CM | POA: Diagnosis not present

## 2024-01-09 DIAGNOSIS — R7309 Other abnormal glucose: Secondary | ICD-10-CM

## 2024-01-09 DIAGNOSIS — E785 Hyperlipidemia, unspecified: Secondary | ICD-10-CM | POA: Insufficient documentation

## 2024-01-09 DIAGNOSIS — E782 Mixed hyperlipidemia: Secondary | ICD-10-CM | POA: Diagnosis not present

## 2024-01-09 DIAGNOSIS — K436 Other and unspecified ventral hernia with obstruction, without gangrene: Secondary | ICD-10-CM | POA: Diagnosis not present

## 2024-01-09 NOTE — Progress Notes (Signed)
 New Patient Office Visit  Subjective    Patient ID: Sierra Browning, female    DOB: [birth year]  Age: 68 y.o. MRN: [id number]  CC:  Chief Complaint  Patient presents with   Establish Care    Has a small bulge inside her belly button. No pain. Has been there since mid August     HPI [name] presents to establish care Discussed the use of AI scribe software for clinical note transcription with the patient, who gave verbal consent to proceed.  History of Present Illness   Sierra Browning is a 68 year old female who presents with a bulging in her abdomen, suspected to be a hernia.  Abdominal bulge - Bulging in the abdomen first noticed in August while trying on a top - Describes the bulge as a hernia that appeared suddenly - Recalls lifting a heavy piece of metal at work prior to onset, suspects this may have contributed - No recent heavy lifting except for a Christmas tree and some clothes - No associated pain, redness, or other symptoms reported  Weight management difficulties - Difficulty losing weight - Attributes weight challenges to consumption of soft drinks, which she finds hard to quit  Glucose monitoring - Not currently diagnosed with diabetes - Has been informed she is borderline for diabetes and needs to monitor her condition  Hyperlipidemia - Takes cholesterol medication - Medications refilled last month  Vitamin d supplementation - Takes over-the-counter Vitamin D supplements       Outpatient Encounter Medications as of 01/09/2024  Medication Sig   cholecalciferol (VITAMIN D3) 25 MCG (1000 UNIT) tablet Take 1,000 Units by mouth daily.   rosuvastatin (CRESTOR) 10 MG tablet Take 10 mg by mouth daily.   No facility-administered encounter medications on file as of 01/09/2024.    Past Medical History:  Diagnosis Date   Back pain    Gallstones    High cholesterol    Hip pain    rt    Past Surgical History:  Procedure Laterality Date    COLONOSCOPY N/A 09/09/2019   Procedure: COLONOSCOPY;  Surgeon: Golda Claudis PENNER, MD;  Location: [facility];  Service: Endoscopy;  Laterality: N/A;  730   CYST REMOVAL NECK     removed when a baby   PATELLA FRACTURE SURGERY      Family History  Problem Relation Age of Onset   Stroke Father    Hypertension Mother    Hypertension Brother     Social History   Socioeconomic History   Marital status: Single    Spouse name: Not on file   Number of children: Not on file   Years of education: Not on file   Highest education level: Not on file  Occupational History   Not on file  Tobacco Use   Smoking status: Former   Smokeless tobacco: Never  Vaping Use   Vaping status: Never Used  Substance and Sexual Activity   Alcohol use: No   Drug use: No   Sexual activity: Not Currently  Other Topics Concern   Not on file  Social History Narrative   Not on file   Social Drivers of Health   Financial Resource Strain: Not on file  Food Insecurity: Not on file  Transportation Needs: Not on file  Physical Activity: Not on file  Stress: Not on file  Social Connections: Unknown (07/24/2021)   Received from [facility]   Social Network    Social Network: Not on  file  Intimate Partner Violence: Unknown (06/15/2021)   Received from [facility]   HITS    Physically Hurt: Not on file    Insult or Talk Down To: Not on file    Threaten Physical Harm: Not on file    Scream or Curse: Not on file    ROS     Objective    BP 120/75   Pulse 85   Resp 16   Ht 5' 4.5 (1.638 m)   Wt 166 lb (75.3 kg)   SpO2 97%   BMI 28.05 kg/m   Physical Exam Vitals and nursing note reviewed.  Constitutional:      Appearance: Normal appearance.  HENT:     Head: Normocephalic.     Right Ear: Tympanic membrane, ear canal and external ear normal.     Left Ear: Tympanic membrane, ear canal and external ear normal.     Nose: Nose normal.     Mouth/Throat:     Mouth: Mucous membranes are moist.      Pharynx: Oropharynx is clear.  Cardiovascular:     Rate and Rhythm: Normal rate and regular rhythm.  Pulmonary:     Effort: Pulmonary effort is normal.     Breath sounds: Normal breath sounds.  Musculoskeletal:     Cervical back: Normal range of motion and neck supple.  Skin:    General: Skin is warm and dry.  Neurological:     Mental Status: She is alert and oriented to person, place, and time.  Psychiatric:        Mood and Affect: Mood normal.        Thought Content: Thought content normal.         Assessment & Plan:   Problem List Items Addressed This Visit       Other   Hyperlipidemia - Primary (Chronic)   Managed with Crestor 10 mg.  Recheck fasting labs today.      Relevant Medications   rosuvastatin (CRESTOR) 10 MG tablet   Other Relevant Orders   CMP14+EGFR (Completed)   Lipid Profile (Completed)   TSH + free T4 (Completed)   Vitamin D deficiency (Chronic)   Currently taking daily vitamin D supplement.  Recheck levels.       Relevant Orders   Vitamin D (25 hydroxy) (Completed)   Elevated hemoglobin A1c (Chronic)   Borderline glucose levels without diabetes diagnosis. - Order lab work to monitor glucose levels.      Relevant Orders   CMP14+EGFR (Completed)   HgB A1c (Completed)   Abdominal hernia (Chronic)   Reducible, asymptomatic hernia likely from heavy lifting. - Monitor for changes in size or symptoms. - Refer to surgeon if hernia becomes painful, irreducible, or increases in size. - Advise weight loss to potentially reduce hernia size.      Other Visit Diagnoses       Need for hepatitis C screening test       Relevant Orders   Hepatitis C Antibody (Completed)     Encounter for immunization       Relevant Orders   Flu vaccine HIGH DOSE PF(Fluzone Trivalent) (Completed)       Return in about 6 months (around 07/08/2024) for chronic follow-up with PCP.   Leita Longs, FNP

## 2024-01-10 LAB — LIPID PANEL
Chol/HDL Ratio: 2.8 ratio (ref 0.0–4.4)
Cholesterol, Total: 160 mg/dL (ref 100–199)
HDL: 58 mg/dL
LDL Chol Calc (NIH): 78 mg/dL (ref 0–99)
Triglycerides: 139 mg/dL (ref 0–149)
VLDL Cholesterol Cal: 24 mg/dL (ref 5–40)

## 2024-01-10 LAB — CMP14+EGFR
ALT: 27 [IU]/L (ref 0–32)
AST: 34 [IU]/L (ref 0–40)
Albumin: 4.4 g/dL (ref 3.9–4.9)
Alkaline Phosphatase: 131 [IU]/L (ref 49–135)
BUN/Creatinine Ratio: 10 — ABNORMAL LOW (ref 12–28)
BUN: 8 mg/dL (ref 8–27)
Bilirubin Total: 0.6 mg/dL (ref 0.0–1.2)
CO2: 22 mmol/L (ref 20–29)
Calcium: 9.3 mg/dL (ref 8.7–10.3)
Chloride: 104 mmol/L (ref 96–106)
Creatinine, Ser: 0.84 mg/dL (ref 0.57–1.00)
Globulin, Total: 2.6 g/dL (ref 1.5–4.5)
Glucose: 90 mg/dL (ref 70–99)
Potassium: 4.2 mmol/L (ref 3.5–5.2)
Sodium: 142 mmol/L (ref 134–144)
Total Protein: 7 g/dL (ref 6.0–8.5)
eGFR: 77 mL/min/{1.73_m2}

## 2024-01-10 LAB — HEMOGLOBIN A1C
Est. average glucose Bld gHb Est-mCnc: 114 mg/dL
Hgb A1c MFr Bld: 5.6 % (ref 4.8–5.6)

## 2024-01-10 LAB — TSH+FREE T4
Free T4: 1.13 ng/dL (ref 0.82–1.77)
TSH: 1.2 u[IU]/mL (ref 0.450–4.500)

## 2024-01-10 LAB — HEPATITIS C ANTIBODY: Hep C Virus Ab: NONREACTIVE

## 2024-01-10 LAB — VITAMIN D 25 HYDROXY (VIT D DEFICIENCY, FRACTURES): Vit D, 25-Hydroxy: 34.3 ng/mL (ref 30.0–100.0)

## 2024-01-15 ENCOUNTER — Ambulatory Visit: Payer: Self-pay

## 2024-01-16 DIAGNOSIS — K469 Unspecified abdominal hernia without obstruction or gangrene: Secondary | ICD-10-CM | POA: Insufficient documentation

## 2024-01-16 NOTE — Assessment & Plan Note (Signed)
 Reducible, asymptomatic hernia likely from heavy lifting. - Monitor for changes in size or symptoms. - Refer to surgeon if hernia becomes painful, irreducible, or increases in size. - Advise weight loss to potentially reduce hernia size.

## 2024-01-16 NOTE — Assessment & Plan Note (Signed)
 Managed with Crestor 10 mg.  Recheck fasting labs today.

## 2024-01-16 NOTE — Assessment & Plan Note (Signed)
 Currently taking daily vitamin D supplement.  Recheck levels.

## 2024-01-16 NOTE — Assessment & Plan Note (Signed)
 Borderline glucose levels without diabetes diagnosis. - Order lab work to monitor glucose levels.

## 2024-07-08 ENCOUNTER — Ambulatory Visit
